# Patient Record
Sex: Female | Born: 1970 | Race: White | Hispanic: No | Marital: Married | Smoking: Never smoker
Health system: Southern US, Community
[De-identification: ages and names within clinical notes are randomized; demographics above are authoritative.]

## PROBLEM LIST (undated history)

## (undated) DIAGNOSIS — C73 Malignant neoplasm of thyroid gland: Secondary | ICD-10-CM

## (undated) DIAGNOSIS — S46219A Strain of muscle, fascia and tendon of other parts of biceps, unspecified arm, initial encounter: Secondary | ICD-10-CM

## (undated) HISTORY — PX: THYROIDECTOMY: SHX17

## (undated) HISTORY — DX: Strain of muscle, fascia and tendon of other parts of biceps, unspecified arm, initial encounter: S46.219A

## (undated) HISTORY — PX: TONSILLECTOMY: SUR1361

## (undated) HISTORY — DX: Malignant neoplasm of thyroid gland: C73

---

## 2001-08-30 ENCOUNTER — Other Ambulatory Visit: Admission: RE | Admit: 2001-08-30 | Discharge: 2001-08-30 | Payer: Self-pay | Admitting: Gynecology

## 2002-01-28 ENCOUNTER — Encounter: Payer: Self-pay | Admitting: Gynecology

## 2002-01-28 ENCOUNTER — Ambulatory Visit (HOSPITAL_COMMUNITY): Admission: AD | Admit: 2002-01-28 | Discharge: 2002-01-28 | Payer: Self-pay | Admitting: Gynecology

## 2002-03-08 ENCOUNTER — Inpatient Hospital Stay (HOSPITAL_COMMUNITY): Admission: AD | Admit: 2002-03-08 | Discharge: 2002-03-10 | Payer: Self-pay | Admitting: Gynecology

## 2002-04-23 ENCOUNTER — Other Ambulatory Visit: Admission: RE | Admit: 2002-04-23 | Discharge: 2002-04-23 | Payer: Self-pay | Admitting: *Deleted

## 2004-08-19 ENCOUNTER — Other Ambulatory Visit: Admission: RE | Admit: 2004-08-19 | Discharge: 2004-08-19 | Payer: Self-pay | Admitting: Gynecology

## 2005-08-24 ENCOUNTER — Other Ambulatory Visit: Admission: RE | Admit: 2005-08-24 | Discharge: 2005-08-24 | Payer: Self-pay | Admitting: Gynecology

## 2005-12-19 HISTORY — PX: AUGMENTATION MAMMAPLASTY: SUR837

## 2006-10-12 ENCOUNTER — Other Ambulatory Visit: Admission: RE | Admit: 2006-10-12 | Discharge: 2006-10-12 | Payer: Self-pay | Admitting: Gynecology

## 2008-03-13 ENCOUNTER — Other Ambulatory Visit: Admission: RE | Admit: 2008-03-13 | Discharge: 2008-03-13 | Payer: Self-pay | Admitting: Gynecology

## 2009-08-13 ENCOUNTER — Encounter: Payer: Self-pay | Admitting: Gynecology

## 2009-08-13 ENCOUNTER — Other Ambulatory Visit: Admission: RE | Admit: 2009-08-13 | Discharge: 2009-08-13 | Payer: Self-pay | Admitting: Gynecology

## 2009-08-13 ENCOUNTER — Ambulatory Visit: Payer: Self-pay | Admitting: Gynecology

## 2010-02-12 ENCOUNTER — Ambulatory Visit: Payer: Self-pay | Admitting: Gynecology

## 2010-07-19 ENCOUNTER — Ambulatory Visit: Payer: Self-pay | Admitting: Gynecology

## 2010-08-16 ENCOUNTER — Other Ambulatory Visit: Admission: RE | Admit: 2010-08-16 | Discharge: 2010-08-16 | Payer: Self-pay | Admitting: Gynecology

## 2010-08-16 ENCOUNTER — Ambulatory Visit: Payer: Self-pay | Admitting: Gynecology

## 2011-01-26 ENCOUNTER — Ambulatory Visit: Payer: Managed Care, Other (non HMO) | Admitting: Gynecology

## 2011-01-26 DIAGNOSIS — N949 Unspecified condition associated with female genital organs and menstrual cycle: Secondary | ICD-10-CM

## 2011-01-27 ENCOUNTER — Ambulatory Visit: Payer: Managed Care, Other (non HMO) | Admitting: Gynecology

## 2011-01-27 ENCOUNTER — Other Ambulatory Visit: Payer: Managed Care, Other (non HMO)

## 2011-02-10 ENCOUNTER — Ambulatory Visit (INDEPENDENT_AMBULATORY_CARE_PROVIDER_SITE_OTHER): Payer: Managed Care, Other (non HMO) | Admitting: Gynecology

## 2011-02-10 DIAGNOSIS — N63 Unspecified lump in unspecified breast: Secondary | ICD-10-CM

## 2011-02-11 ENCOUNTER — Other Ambulatory Visit: Payer: Self-pay | Admitting: Gynecology

## 2011-02-11 DIAGNOSIS — N63 Unspecified lump in unspecified breast: Secondary | ICD-10-CM

## 2011-02-15 ENCOUNTER — Ambulatory Visit
Admission: RE | Admit: 2011-02-15 | Discharge: 2011-02-15 | Disposition: A | Discharge status: Home / Self Care | Payer: Managed Care, Other (non HMO) | Source: Ambulatory Visit | Attending: Gynecology | Admitting: Gynecology

## 2011-02-15 ENCOUNTER — Other Ambulatory Visit: Payer: Self-pay | Admitting: Gynecology

## 2011-02-15 DIAGNOSIS — N63 Unspecified lump in unspecified breast: Secondary | ICD-10-CM

## 2011-05-06 NOTE — Discharge Summary (Signed)
[facility]  Patient:    CALEE, NUGENT Visit Number: [id number] MRN: [id number]          Service Type: OBS Location: 910A [id number] Attending Physician:  Douglass Rivers Dictated by:   Antony Contras, [facility] Admit Date:  03/08/2002 Discharge Date: 03/10/2002                             Discharge Summary  DISCHARGE DIAGNOSES:          1. Intrauterine pregnancy at 37+ weeks.                               2. Prolonged rupture of membranes.  PROCEDURES:                   Normal spontaneous vaginal delivery of viable infant over midline episiotomy.  HISTORY OF PRESENT ILLNESS:   The patient is a 40 year old gravida 2, para 1-0-0-1, with a LMP of June 18, 2001, [facility] March 26, 2002.  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  LABORATORY DATA:              Blood type A positive, antibody screen negative, RPR, HBsAg, HIV nonreactive.  GBS normal.  HOSPITAL COURSE:              The patient presented March 08, 2002, with contraction every 5 minutes, also reported had rupture of membranes x 1 to 2 days.  She was augmented with Pitocin and also given antibiotic prophylaxis.  She progressed to complete dilatation, delivered an Apgar 7/8 female infant weighing 7 pounds 6 ounces over a midline episiotomy with no laceration.  During postpartum course, she remained afebrile, had no difficulty voiding. She was discharged on her second postpartum day in satisfactory condition.  DISPOSITION:                  Follow up in six weeks.  DISCHARGE MEDICATIONS:        Continue with prenatal vitamins, iron, Motrin, Tylox for pain.  DISCHARGE LABORATORY DATA:    CBC: Hematocrit 38, hemoglobin 13.2, WBC 11.8, platelets 223. Dictated by:   Antony Contras, [facility] Attending Physician:  Douglass Rivers DD:  03/20/02 TD:  03/20/02 Job: [id number]

## 2011-05-19 ENCOUNTER — Ambulatory Visit (INDEPENDENT_AMBULATORY_CARE_PROVIDER_SITE_OTHER): Payer: Managed Care, Other (non HMO) | Admitting: Sports Medicine

## 2011-05-19 ENCOUNTER — Encounter: Payer: Self-pay | Admitting: Sports Medicine

## 2011-05-19 VITALS — BP 105/69 | HR 62 | Ht 63.5 in | Wt 120.0 lb

## 2011-05-19 DIAGNOSIS — M76899 Other specified enthesopathies of unspecified lower limb, excluding foot: Secondary | ICD-10-CM

## 2011-05-19 DIAGNOSIS — M79609 Pain in unspecified limb: Secondary | ICD-10-CM

## 2011-05-19 DIAGNOSIS — M79606 Pain in leg, unspecified: Secondary | ICD-10-CM

## 2011-05-19 DIAGNOSIS — M6580 Other synovitis and tenosynovitis, unspecified site: Secondary | ICD-10-CM

## 2011-05-19 MED ORDER — NITROGLYCERIN 0.2 MG/HR TD PT24: 1.0000 | MEDICATED_PATCH | Freq: Every day | TRANSDERMAL | Status: DC

## 2011-05-19 NOTE — Assessment & Plan Note (Signed)
Begin acentric calf exercises. Use one quarter of a nitroglycerin patch daily to encourage blood flow and healing. Okay to remain active but also use a heel lift in all shoes for running. Use a compression sleeve over the knee and upper calf.  Return for recheck in 6 weeks

## 2011-05-19 NOTE — Progress Notes (Signed)
  Subjective:    Patient ID: Alicia Myers, female    DOB: June 21, 1971, 40 y.o.   MRN: [id number]  [facility] patient here for 1 year of worsening right leg pain, now a constant ache.  1-3 miles per day and weights at gym. No injuries or new workout regimen.   Acute onset- locked up one day while running, with extreme pain, will become unlocked but becomes sore for days after injury.  No catching or popping or swelling.  On a typical run, after 1 mile outdoors or 2 miles indoors will start to have ache in the back of her leg.  If she continues to run, will pop, be very sore afterwards and be very hard to bend.  Has run with a 25 incline, no worse.  Has tried wearing knee sleeve with improvement.  Saw Dr. Margaretha Sheffield 1 week ago and had negative knee xray.  Was referred for ultrasound.    Review of Systems see HPI   Objective:   Physical Exam Knee: Normal to inspection with no erythema or effusion or obvious bony abnormalities. Palpation normal with no warmth or joint line tenderness or patellar tenderness or condyle tenderness.   Tender to palpation over right biceps femoris tendon ROM normal in flexion and extension and lower leg rotation. Ligaments with solid consistent endpoints including ACL, PCL, LCL, MCL. Negative Mcmurray's and provocative meniscal tests. Non painful patellar compression. Patellar and quadriceps tendons unremarkable. Hamstring and quadriceps strength is normal.   MSK [place]: Right knee:  Tear of right gastrocnemius insertion to femoral side of post knee with evidence of neovascularization.  There is an intact biceps femoris.  Remainder of Post Knee space is wnl.    Assessment & Plan:

## 2011-05-19 NOTE — Assessment & Plan Note (Addendum)
Discussed hamstring and foot drop exercises.   .  Follow-up in 6 weeks.

## 2011-07-21 ENCOUNTER — Other Ambulatory Visit: Payer: Self-pay | Admitting: *Deleted

## 2011-07-21 DIAGNOSIS — F419 Anxiety disorder, unspecified: Secondary | ICD-10-CM

## 2011-07-21 MED ORDER — ALPRAZOLAM 0.5 MG PO TABS: 0.5000 mg | ORAL_TABLET | Freq: Every evening | ORAL | Status: AC | PRN

## 2011-07-21 NOTE — Telephone Encounter (Signed)
Needs annual exam after 08/14/2011

## 2011-07-21 NOTE — Telephone Encounter (Signed)
[facility] FAXED OVER REFILL REQUEST. IF PT CAN HAVE REFILLS LET ME WILL CALL IN RX.

## 2011-07-21 NOTE — Telephone Encounter (Signed)
RX CALLED IN TO [facility].

## 2011-07-26 ENCOUNTER — Telehealth: Payer: Self-pay | Admitting: *Deleted

## 2011-07-26 DIAGNOSIS — E039 Hypothyroidism, unspecified: Secondary | ICD-10-CM

## 2011-07-26 MED ORDER — LEVOTHYROXINE SODIUM 125 MCG PO TABS: 125.0000 ug | ORAL_TABLET | Freq: Every day | ORAL | Status: DC

## 2011-07-26 NOTE — Telephone Encounter (Signed)
[facility]ARM[facility]Y FAXED OVER REFILL REQUEST FOR SYNTHROID RX. PLEASE ADVISE.

## 2011-07-26 NOTE — Telephone Encounter (Signed)
I refilled her x2 months. She is due for her annual end of August beginning of September.

## 2011-07-29 ENCOUNTER — Ambulatory Visit: Payer: Managed Care, Other (non HMO) | Admitting: Sports Medicine

## 2011-08-04 ENCOUNTER — Encounter: Payer: Self-pay | Admitting: Sports Medicine

## 2011-08-04 ENCOUNTER — Ambulatory Visit (INDEPENDENT_AMBULATORY_CARE_PROVIDER_SITE_OTHER): Payer: Managed Care, Other (non HMO) | Admitting: Sports Medicine

## 2011-08-04 VITALS — BP 125/76 | HR 74 | Temp 98.0°F

## 2011-08-04 DIAGNOSIS — S838X9A Sprain of other specified parts of unspecified knee, initial encounter: Secondary | ICD-10-CM

## 2011-08-04 DIAGNOSIS — M7630 Iliotibial band syndrome, unspecified leg: Secondary | ICD-10-CM | POA: Insufficient documentation

## 2011-08-04 DIAGNOSIS — M6580 Other synovitis and tenosynovitis, unspecified site: Secondary | ICD-10-CM

## 2011-08-04 DIAGNOSIS — M79606 Pain in leg, unspecified: Secondary | ICD-10-CM

## 2011-08-04 DIAGNOSIS — M79609 Pain in unspecified limb: Secondary | ICD-10-CM

## 2011-08-04 DIAGNOSIS — S86119A Strain of other muscle(s) and tendon(s) of posterior muscle group at lower leg level, unspecified leg, initial encounter: Secondary | ICD-10-CM | POA: Insufficient documentation

## 2011-08-04 NOTE — Progress Notes (Signed)
  Subjective:    Patient ID: Alicia Myers, female    DOB: Jan 01, 1971, 40 y.o.   MRN: [id number]  HPI  Alicia Myers is a 40 yo female patient avid runner coming today to F/U on her right knee gastrocnemius tear. This is a cryonic condition. She has had this pain for a year. There was not a particular injury that produced the pain. She was seen on 05/12, MSK U/S was done which showed a defect in the tendon of the lateral head of the gastrocnemius. She was started on the nitro patch. She states that she has improved compare with her previous visit, she is able to run a mile longer. Her pain is a dull ache, 3/10, located on the posterolateral aspect of her knee, worsen by running ,improved by resting.  Also  numbness on her second and third toe while running for the last week.  Past Medical History  Diagnosis Date  . Thyroid cancer     PAPILLARY THYROID CA --THYROIDECTOMY   Current Outpatient Prescriptions on File Prior to Visit  Medication Sig Dispense Refill  . ALPRAZolam (XANAX) 0.5 MG tablet Take 1 tablet (0.5 mg total) by mouth at bedtime as needed for sleep.  30 tablet  0  . Levonorgest-Eth Estrad 91-Day (SEASONIQUE) 0.15-0.03 &0.01 MG TABS Take 1 tablet by mouth daily.        Marland Kitchen levothyroxine (SYNTHROID, LEVOTHROID) 125 MCG tablet Take 1 tablet (125 mcg total) by mouth daily.  30 tablet  1  . nitroGLYCERIN (NITRODUR - DOSED IN MG/24 HR) 0.2 mg/hr Place 1 patch (0.2 mg total) onto the skin daily.  30 patch  1   Allergies  Allergen Reactions  . Sulfa Antibiotics Hives and Other (See Comments)    Passed out     Review of Systems  Constitutional: Negative for fever, diaphoresis and fatigue.  Musculoskeletal: Negative for back pain and joint swelling.       Right posterior knee pain with HPI  Neurological: Negative for weakness and numbness.       Objective:   Physical Exam  Constitutional: She appears well-developed and well-nourished.       BP 125/76  Pulse 74  Temp(Src)  98 F (36.7 C) (Oral)   Eyes: EOM are normal.  Pulmonary/Chest: Effort normal.  Musculoskeletal:       Right knee with intact skin, FROM. No Patellofemoral crepitus present with flexion and extension. No tenderness on the quad neither or patellar tendon. Ligaments intact. Lachman neg. Varus and valgus test at 0 and 30 degres neg TTP in the posterolateral aspect of her knee on the tendon of the lateral head of the gastrocnemius. There is mild TTP on the iliotibial band . Ober test negative.  Good quad muscle definition.  Normal gait without a limp.   Neurological: She is alert.  Skin: No rash noted. No erythema. No pallor.  Psychiatric: She has a normal mood and affect. Judgment normal.     MSK U/S of the knee with small tear in the tendon of the lateral head of the gastroc, mild vascular flow. Ulnar nerve intact. Biceps tendon intact.     Assessment & Plan:   1. Gastrocnemius muscle tear   2. Leg pain   3. Iliotibial band tendonitis    Cont nitro patch. Cont gastroc strengthening and stretching exercise. Iliotibial band stretches shown. Handout given Cont heel lift. F/U in 4 weeks.

## 2011-09-07 ENCOUNTER — Other Ambulatory Visit (HOSPITAL_COMMUNITY)
Admission: RE | Admit: 2011-09-07 | Discharge: 2011-09-07 | Disposition: A | Discharge status: Home / Self Care | Payer: Managed Care, Other (non HMO) | Source: Ambulatory Visit | Attending: Gynecology | Admitting: Gynecology

## 2011-09-07 ENCOUNTER — Ambulatory Visit (INDEPENDENT_AMBULATORY_CARE_PROVIDER_SITE_OTHER): Payer: Managed Care, Other (non HMO) | Admitting: Gynecology

## 2011-09-07 ENCOUNTER — Encounter: Payer: Self-pay | Admitting: Gynecology

## 2011-09-07 VITALS — BP 112/70 | Ht 64.0 in | Wt 112.0 lb

## 2011-09-07 DIAGNOSIS — E039 Hypothyroidism, unspecified: Secondary | ICD-10-CM

## 2011-09-07 DIAGNOSIS — Z131 Encounter for screening for diabetes mellitus: Secondary | ICD-10-CM

## 2011-09-07 DIAGNOSIS — S46219A Strain of muscle, fascia and tendon of other parts of biceps, unspecified arm, initial encounter: Secondary | ICD-10-CM | POA: Insufficient documentation

## 2011-09-07 DIAGNOSIS — Z01419 Encounter for gynecological examination (general) (routine) without abnormal findings: Secondary | ICD-10-CM | POA: Insufficient documentation

## 2011-09-07 DIAGNOSIS — Z13220 Encounter for screening for lipoid disorders: Secondary | ICD-10-CM

## 2011-09-07 MED ORDER — NORETHINDRONE ACET-ETHINYL EST 1-20 MG-MCG PO TABS: 1.0000 | ORAL_TABLET | Freq: Every day | ORAL | Status: DC

## 2011-09-07 NOTE — Progress Notes (Signed)
Alicia Myers [birth year] [id number]        40 y.o.  for annual exam.  Complaining of 1 month history of fatigue. She is on thyroid replacement. No other symptoms such as weight changes hair changes skin changes.  Past medical history,surgical history, medications, allergies, family history and social history were all reviewed and documented in the EPIC chart. ROS:  Was performed and pertinent positives and negatives are included in the history.  Exam: chaperone present Filed Vitals:   09/07/11 1036  BP: 112/70   General appearance  Normal Skin grossly normal Head/Neck normal with no cervical or supraclavicular adenopathy thyroid normal Lungs  clear Cardiac RR, without RMG Abdominal  soft, nontender, without masses, organomegaly or hernia Breasts  examined lying and sitting.  Left without masses, retractions, discharge or axillary adenopathy.  Right small subcutaneous nodule 12:00 3 fingerbreadths off the nipple freely mobile no overlying skin changes, nipple discharge, retraction, axillary adenopathy.  Bilateral implants noted.  Pelvic  Ext/BUS/vagina  normal   Cervix  normal  Pap done  Uterus  anteverted, normal size, shape and contour, midline and mobile nontender   Adnexa  Without masses or tenderness    Anus and perineum  normal   Rectovaginal  normal sphincter tone without palpated masses or tenderness.    Assessment/Plan:  40 y.o. female for annual exam.    #1 Right breast lump. Discovered earlier this year. It is stable on exam both to the patient and myself. She had a negative diagnostic mammography and a negative ultrasound in February. Options for restudied now continued observation or excisional biopsy were reviewed. Patient's comfortable with observation she'll followup for recheck of mammogram ultrasound in February as recommended by the radiologist sooner if this changes at all or she changes her mind and wants to have it excised. #2 Fatigue. I suspect this is  situational but we'll go ahead and check TSH to make sure that she is on appropriate thyroid replacement.  We'll also check CBC. #3  Contraception. Patient using Loestrin 1/20 continuous with every three-month withdrawal doing well. Wants to continue and I refilled her times a year. We will check baseline lipid profile glucose urinalysis with the above blood work.     Dara Lords MD, 11:07 AM 09/07/2011

## 2011-09-14 ENCOUNTER — Other Ambulatory Visit: Payer: Managed Care, Other (non HMO) | Admitting: Sports Medicine

## 2011-09-16 ENCOUNTER — Other Ambulatory Visit: Payer: Self-pay | Admitting: Gynecology

## 2011-09-16 DIAGNOSIS — E039 Hypothyroidism, unspecified: Secondary | ICD-10-CM

## 2011-09-16 MED ORDER — LEVOTHYROXINE SODIUM 112 MCG PO TABS: 112.0000 ug | ORAL_TABLET | Freq: Every day | ORAL | Status: DC

## 2011-10-03 ENCOUNTER — Other Ambulatory Visit: Payer: Self-pay | Admitting: Vascular Surgery

## 2011-10-03 ENCOUNTER — Ambulatory Visit (INDEPENDENT_AMBULATORY_CARE_PROVIDER_SITE_OTHER): Payer: Managed Care, Other (non HMO) | Admitting: Sports Medicine

## 2011-10-03 VITALS — BP 100/70

## 2011-10-03 DIAGNOSIS — S86119A Strain of other muscle(s) and tendon(s) of posterior muscle group at lower leg level, unspecified leg, initial encounter: Secondary | ICD-10-CM

## 2011-10-03 DIAGNOSIS — S838X9A Sprain of other specified parts of unspecified knee, initial encounter: Secondary | ICD-10-CM

## 2011-10-03 DIAGNOSIS — M79606 Pain in leg, unspecified: Secondary | ICD-10-CM

## 2011-10-03 NOTE — Progress Notes (Signed)
  Subjective:    Patient ID: Alicia Myers, female    DOB: 11-08-1971, 40 y.o.   MRN: [id number]  HPI  40 y/o female is here for follow up for a right gastronemius tear. She has been on NTG since May.  She noticed improvement initially but there has been no change since her last visit.  She is unable to run. She walks at a 10% incline at 4.0 mph on the treadmill with only a small amount of discomfort.  If she tries to run she will get pain and sometimes numbness of the foot prior to one mile.  She wears heels for work and finds this much more comfortable than flat shoes.  Compression sleeve does help make the post knee feel more stable and less pain  Review of Systems     Objective:   Physical Exam  Palpation of the head of the medial gastroc reproduces her pain Mild tenderness to palpation about the distal hamstring, different from her usual pain with running No erythema or swelling over the area of pain ROM at the knee is normal There is no pain or swelling about the anterior knee The hamstring strength is normal with neutral foot, inversion, and eversion  Review of [place] does suggest small insertional tear of tendon      Assessment & Plan:

## 2011-10-03 NOTE — Assessment & Plan Note (Signed)
An MRI is ordered to further evaluate the area of pain.  Because this is the area where the gastrocnemius and the hamstrings co mingle it is possible that she also has a tear of the distal hamstring, which would have a surgical solution.  Discussed with the patient that if her MRI doesn't show any finding in addition to the gastroc tear that there will likley not be a surgical solution.

## 2011-10-03 NOTE — Patient Instructions (Signed)
Your MRI appointment has been scheduled for 10/06/11 at 12:30 at [address] [facility].

## 2011-10-05 ENCOUNTER — Telehealth: Payer: Self-pay | Admitting: Sports Medicine

## 2011-10-05 ENCOUNTER — Other Ambulatory Visit: Payer: Self-pay | Admitting: Sports Medicine

## 2011-10-05 ENCOUNTER — Other Ambulatory Visit: Payer: Self-pay | Admitting: *Deleted

## 2011-10-05 DIAGNOSIS — M79606 Pain in leg, unspecified: Secondary | ICD-10-CM

## 2011-10-05 DIAGNOSIS — S86119A Strain of other muscle(s) and tendon(s) of posterior muscle group at lower leg level, unspecified leg, initial encounter: Secondary | ICD-10-CM

## 2011-10-05 NOTE — Telephone Encounter (Signed)
[facility] is needing an order faxed to them for the mri.  Fax number is [phone]

## 2011-10-06 ENCOUNTER — Ambulatory Visit
Admission: RE | Admit: 2011-10-06 | Discharge: 2011-10-06 | Disposition: A | Discharge status: Home / Self Care | Payer: Managed Care, Other (non HMO) | Source: Ambulatory Visit | Attending: Sports Medicine | Admitting: Sports Medicine

## 2011-10-06 ENCOUNTER — Other Ambulatory Visit: Payer: Managed Care, Other (non HMO)

## 2011-10-06 DIAGNOSIS — M79606 Pain in leg, unspecified: Secondary | ICD-10-CM

## 2011-10-27 ENCOUNTER — Other Ambulatory Visit: Payer: Self-pay | Admitting: *Deleted

## 2011-10-27 MED ORDER — ALPRAZOLAM 0.5 MG PO TABS: 0.5000 mg | ORAL_TABLET | Freq: Every evening | ORAL | Status: DC | PRN

## 2011-10-27 NOTE — Telephone Encounter (Signed)
rx called into pharmacy

## 2011-11-22 ENCOUNTER — Other Ambulatory Visit: Payer: Self-pay | Admitting: *Deleted

## 2011-11-22 MED ORDER — ALPRAZOLAM 0.5 MG PO TABS: 0.5000 mg | ORAL_TABLET | Freq: Every evening | ORAL | Status: DC | PRN

## 2011-11-22 NOTE — Telephone Encounter (Signed)
rx called into pharmacy

## 2012-02-27 ENCOUNTER — Other Ambulatory Visit: Payer: Self-pay | Admitting: *Deleted

## 2012-02-27 MED ORDER — ALPRAZOLAM 0.5 MG PO TABS: 0.5000 mg | ORAL_TABLET | Freq: Every evening | ORAL | Status: DC | PRN

## 2012-02-27 NOTE — Telephone Encounter (Signed)
rx called in

## 2012-03-28 ENCOUNTER — Other Ambulatory Visit: Payer: Self-pay | Admitting: *Deleted

## 2012-03-28 MED ORDER — ALPRAZOLAM 0.5 MG PO TABS: 0.5000 mg | ORAL_TABLET | Freq: Every evening | ORAL | Status: DC | PRN

## 2012-03-28 NOTE — Telephone Encounter (Signed)
rx called in

## 2012-04-12 ENCOUNTER — Other Ambulatory Visit: Payer: Managed Care, Other (non HMO)

## 2012-04-12 ENCOUNTER — Telehealth: Payer: Self-pay | Admitting: *Deleted

## 2012-04-12 DIAGNOSIS — E039 Hypothyroidism, unspecified: Secondary | ICD-10-CM

## 2012-04-12 LAB — TSH: TSH: 2.183 u[IU]/mL (ref 0.350–4.500)

## 2012-04-12 NOTE — Telephone Encounter (Signed)
Pt came in and asked about her TSH. She wanted it checked. I talked to [name] since TF wasn't here and she said ok. [name] had not called back last fall when last had it checked and so therefore Alicia Myers said ok to get it checked now before changing thyroid medication dose. Pt will be notified of results

## 2012-04-26 DIAGNOSIS — Z85850 Personal history of malignant neoplasm of thyroid: Secondary | ICD-10-CM | POA: Insufficient documentation

## 2012-04-26 DIAGNOSIS — E039 Hypothyroidism, unspecified: Secondary | ICD-10-CM | POA: Insufficient documentation

## 2012-04-26 DIAGNOSIS — F419 Anxiety disorder, unspecified: Secondary | ICD-10-CM | POA: Insufficient documentation

## 2012-06-08 ENCOUNTER — Other Ambulatory Visit (HOSPITAL_COMMUNITY): Payer: Self-pay | Admitting: Plastic Surgery

## 2012-06-08 DIAGNOSIS — S0285XA Fracture of orbit, unspecified, initial encounter for closed fracture: Secondary | ICD-10-CM

## 2012-06-12 ENCOUNTER — Other Ambulatory Visit (HOSPITAL_COMMUNITY): Payer: Managed Care, Other (non HMO)

## 2012-06-15 ENCOUNTER — Other Ambulatory Visit: Payer: Self-pay | Admitting: Gynecology

## 2012-06-26 ENCOUNTER — Other Ambulatory Visit: Payer: Self-pay

## 2012-06-26 MED ORDER — ALPRAZOLAM 0.5 MG PO TABS: 0.5000 mg | ORAL_TABLET | Freq: Every evening | ORAL | Status: DC | PRN

## 2012-06-26 NOTE — Telephone Encounter (Signed)
RX CALLED INTO [facility] AT [facility].

## 2012-08-14 ENCOUNTER — Other Ambulatory Visit: Payer: Self-pay | Admitting: Gynecology

## 2012-08-14 MED ORDER — LEVONORGEST-ETH EST & ETH EST 42-21-21-7 DAYS PO TABS: 1.0000 | ORAL_TABLET | Freq: Every day | ORAL | Status: DC

## 2012-08-14 NOTE — Progress Notes (Signed)
Patient stopped by office and asked to try quartette birth control pills which is in a new extended formulation. When pack prescribed. Patient due for her annual in next month and will follow for this.

## 2012-08-23 ENCOUNTER — Ambulatory Visit (INDEPENDENT_AMBULATORY_CARE_PROVIDER_SITE_OTHER): Payer: Managed Care, Other (non HMO) | Admitting: Sports Medicine

## 2012-08-23 VITALS — Ht 64.0 in | Wt 120.0 lb

## 2012-08-23 DIAGNOSIS — M7630 Iliotibial band syndrome, unspecified leg: Secondary | ICD-10-CM

## 2012-08-23 DIAGNOSIS — M6580 Other synovitis and tenosynovitis, unspecified site: Secondary | ICD-10-CM

## 2012-08-23 NOTE — Progress Notes (Signed)
Alicia Myers is a 41 y.o. female who presents to [facility] today for left knee pain. Patient has had left lateral knee pain for the last 6 months. The pain occurred without injury and is not associated with swelling locking or catching. She is a runner with a goal of running 15-20 miles per week. She had to stop running because of the pain. She did a period of relative rest and tried to run a 5K on July 4 and had significant pain. She has been unable to run more than 1 mile because of pain since July.   She has not tried any specific exercises or medications.  Running worsens her pain rest improves her pain. No weakness numbness or radiating pain.   PMH reviewed. Significant for history of thyroid cancer History  Substance Use Topics  . Smoking status: Never Smoker   . Smokeless tobacco: Never Used  . Alcohol Use: Yes   ROS as above otherwise neg   Exam:  Ht 5\' 4"  (1.626 m)  Wt 120 lb (54.432 kg)  BMI 20.60 kg/m2 Gen: Well NAD MSK: Left knee: Normal-appearing nontender throughout. Range of motion normal Negative patellar grind test. Negative Lachman's and anterior drawer. Negative McMurray's and Thessaly tests.  Negative valgus and varus stress.  Hips:  Range of motion normal.  Left hip abduction 4/5 right hip abduction 5/5.  Hip flexion and adduction are normal bilaterally.  Feet:  Morton's feet bilaterally with some collapse of longitudinal arch with normal heel valgus on toe standing. Foot motion sensation and pulses are normal bilaterally.   Leg length:  No leg length discrepancy  Gait:  Forefoot Striker.   Left foot mild external rotation.  Slightly widened gait.  Otherwise normal without significant dynamic genu valgum

## 2012-08-23 NOTE — Assessment & Plan Note (Signed)
Patient with left distal IT band. Plan:  Body helix knee sleeve,  Sports insoles with replaced heel lifts for history of gastrocnemius muscle tear Hip abductor strengthening exercises/ Graduated return to running Followup in 6 weeks or sooner if needed

## 2012-08-23 NOTE — Patient Instructions (Addendum)
Thank you for coming in today. 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  Exercises: Hip abduction strength.  1) Side leg lifts 15 reps 3 sets 2x a day.  2) Step ups.  3) Side steps ups 4) Cross over step ups.   Try the IT band stretches.   Try the insoles and knee sleeve.   Come back in 6 weeks or so.   Advance running as tolerated by pain.

## 2012-08-28 ENCOUNTER — Other Ambulatory Visit: Payer: Self-pay | Admitting: *Deleted

## 2012-08-28 MED ORDER — ALPRAZOLAM 0.5 MG PO TABS: 0.5000 mg | ORAL_TABLET | Freq: Every evening | ORAL | Status: DC | PRN

## 2012-08-28 NOTE — Telephone Encounter (Signed)
Annual scheduled

## 2012-09-11 ENCOUNTER — Encounter: Payer: Self-pay | Admitting: Gynecology

## 2012-09-11 ENCOUNTER — Ambulatory Visit (INDEPENDENT_AMBULATORY_CARE_PROVIDER_SITE_OTHER): Payer: Managed Care, Other (non HMO) | Admitting: Gynecology

## 2012-09-11 VITALS — BP 116/62 | Ht 64.0 in | Wt 124.0 lb

## 2012-09-11 DIAGNOSIS — E039 Hypothyroidism, unspecified: Secondary | ICD-10-CM

## 2012-09-11 DIAGNOSIS — Z01419 Encounter for gynecological examination (general) (routine) without abnormal findings: Secondary | ICD-10-CM

## 2012-09-11 DIAGNOSIS — Z13220 Encounter for screening for lipoid disorders: Secondary | ICD-10-CM

## 2012-09-11 DIAGNOSIS — Z131 Encounter for screening for diabetes mellitus: Secondary | ICD-10-CM

## 2012-09-11 LAB — CBC WITH DIFFERENTIAL/PLATELET
Basophils Absolute: 0.1 10*3/uL (ref 0.0–0.1)
Basophils Relative: 1 % (ref 0–1)
Eosinophils Absolute: 0.2 10*3/uL (ref 0.0–0.7)
MCHC: 33.7 g/dL (ref 30.0–36.0)
Monocytes Absolute: 0.4 10*3/uL (ref 0.1–1.0)
Neutro Abs: 4 10*3/uL (ref 1.7–7.7)
Neutrophils Relative %: 59 % (ref 43–77)
RDW: 13 % (ref 11.5–15.5)

## 2012-09-11 LAB — LIPID PANEL
Cholesterol: 187 mg/dL (ref 0–200)
VLDL: 20 mg/dL (ref 0–40)

## 2012-09-11 NOTE — Progress Notes (Signed)
Alicia Myers 16-May-1971 [id number]        41 y.o.  G2P2 for annual exam.  Doing well.  Past medical history,surgical history, medications, allergies, family history and social history were all reviewed and documented in the EPIC chart. ROS:  Was performed and pertinent positives and negatives are included in the history.  Exam: Alicia Myers assistant Filed Vitals:   09/11/12 1005  BP: 116/62  Height: 5\' 4"  (1.626 m)  Weight: 124 lb (56.246 kg)   General appearance  Normal Skin grossly normal Head/Neck normal with no cervical or supraclavicular adenopathy thyroid normal Lungs  clear Cardiac RR, without RMG Abdominal  soft, nontender, without masses, organomegaly or hernia Breasts  examined lying and sitting without masses, retractions, discharge or axillary adenopathy.  Bilateral implants noted. Pelvic  Ext/BUS/vagina  normal   Cervix  normal   Uterus  anteverted, normal size, shape and contour, midline and mobile nontender   Adnexa  Without masses or tenderness    Anus and perineum  normal   Rectovaginal  normal sphincter tone without palpated masses or tenderness.    Assessment/Plan:  41 y.o. G2P2 female for annual exam.   1. Contraception. Patient on Quartette extended low dose BCP. Doing well wants to continue and we'll refill x1 year. 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 2. Breast health.  Patient had  Right breast mass last year 12:00 position 3 cm from the areola. Follow up mammogram/ultrasound was negative the patient notes that she can no longer feel this area on self-exam. Her exam today is totally normal without any palpable abnormalities. I suspect this was an area of normal tissue that has become less prominent. She'll continue with self breast exams as long as no palpable areas we'll continue to follow. She is overdue for her mammogram I recommended her to schedule this and she agrees to do  so. 3. Pap smear. Last Pap smear 2012. No Pap smear done today. Patient has no history of abnormal Pap smears with numerous normal records in her chart. Will plan less frequent screening every 3-5 years by current screening guidelines. 4. Pregnancy. She did have questions about pregnancy in the 32s. I discussed the increased risk of chromosomal abnormalities as well as the increased risk of metabolic maternal diseases such as gestational diabetes and gestational hypertension with advancing maternal age. Reviewed prenatal diagnostic testing options. Patient not interested in actively pursuing at this time but just had general questions. 5. Health maintenance.  CBC lipid profile glucose urinalysis and TSH ordered with her history of hypothyroidism. Follow up one year, sooner as needed.    Dara Lords MD, 12:17 PM 09/11/2012

## 2012-09-11 NOTE — Patient Instructions (Signed)
Follow up in one year for annual exam 

## 2012-09-12 LAB — URINALYSIS W MICROSCOPIC + REFLEX CULTURE
Bacteria, UA: NONE SEEN
Hgb urine dipstick: NEGATIVE
Ketones, ur: NEGATIVE mg/dL
Nitrite: NEGATIVE
Specific Gravity, Urine: 1.024 (ref 1.005–1.030)
Urobilinogen, UA: 0.2 mg/dL (ref 0.0–1.0)

## 2012-09-12 LAB — TSH: TSH: 3.187 u[IU]/mL (ref 0.350–4.500)

## 2012-09-22 ENCOUNTER — Other Ambulatory Visit: Payer: Self-pay | Admitting: Gynecology

## 2012-10-02 ENCOUNTER — Other Ambulatory Visit: Payer: Self-pay | Admitting: *Deleted

## 2012-10-03 MED ORDER — ALPRAZOLAM 0.5 MG PO TABS: 0.5000 mg | ORAL_TABLET | Freq: Every evening | ORAL | Status: DC | PRN

## 2012-10-04 ENCOUNTER — Ambulatory Visit: Payer: Managed Care, Other (non HMO) | Admitting: Sports Medicine

## 2012-11-29 ENCOUNTER — Other Ambulatory Visit: Payer: Self-pay | Admitting: *Deleted

## 2012-11-29 MED ORDER — ALPRAZOLAM 0.5 MG PO TABS: 0.5000 mg | ORAL_TABLET | Freq: Every evening | ORAL | Status: DC | PRN

## 2012-11-29 NOTE — Telephone Encounter (Signed)
Called in [facility] 

## 2012-12-09 ENCOUNTER — Other Ambulatory Visit: Payer: Self-pay | Admitting: Gynecology

## 2012-12-31 ENCOUNTER — Other Ambulatory Visit: Payer: Self-pay | Admitting: *Deleted

## 2012-12-31 MED ORDER — ALPRAZOLAM 0.5 MG PO TABS: 0.5000 mg | ORAL_TABLET | Freq: Every evening | ORAL | Status: DC | PRN

## 2012-12-31 NOTE — Telephone Encounter (Signed)
Last filled 11/29/12

## 2013-01-01 NOTE — Telephone Encounter (Signed)
Called in [facility] 

## 2013-03-27 ENCOUNTER — Other Ambulatory Visit: Payer: Self-pay | Admitting: *Deleted

## 2013-03-27 MED ORDER — ALPRAZOLAM 0.5 MG PO TABS: 0.5000 mg | ORAL_TABLET | Freq: Every evening | ORAL | Status: DC | PRN

## 2013-03-27 NOTE — Telephone Encounter (Signed)
Rx request 

## 2013-03-28 NOTE — Telephone Encounter (Signed)
rx called in

## 2013-04-13 ENCOUNTER — Other Ambulatory Visit: Payer: Self-pay | Admitting: Gynecology

## 2013-07-29 ENCOUNTER — Other Ambulatory Visit: Payer: Self-pay | Admitting: Gynecology

## 2013-09-13 ENCOUNTER — Encounter: Payer: Self-pay | Admitting: Gynecology

## 2013-09-13 ENCOUNTER — Ambulatory Visit (INDEPENDENT_AMBULATORY_CARE_PROVIDER_SITE_OTHER): Payer: 59 | Admitting: Gynecology

## 2013-09-13 VITALS — BP 112/68 | Ht 64.0 in | Wt 120.0 lb

## 2013-09-13 DIAGNOSIS — Z01419 Encounter for gynecological examination (general) (routine) without abnormal findings: Secondary | ICD-10-CM

## 2013-09-13 DIAGNOSIS — C73 Malignant neoplasm of thyroid gland: Secondary | ICD-10-CM

## 2013-09-13 MED ORDER — LEVOTHYROXINE SODIUM 112 MCG PO TABS: ORAL_TABLET | ORAL | Status: DC

## 2013-09-13 MED ORDER — NORETHIN ACE-ETH ESTRAD-FE 1-20 MG-MCG PO TABS: ORAL_TABLET | ORAL | Status: DC

## 2013-09-13 NOTE — Patient Instructions (Signed)
Call to Schedule your mammogram  Facilities in [place]: 1)  [facility], [address] Phone: [phone] 2)  [facility]. [facility], [address] Phone: [phone] 3)  Dr. Bertrand at [facility]  [address] Phone: [phone]     Mammogram A mammogram is an X-ray test to find changes in a woman's breast. You should get a mammogram if:  You are 40 years of age or older  You have risk factors.   Your doctor recommends that you have one.  BEFORE THE TEST  Do not schedule the test the week before your period, especially if your breasts are sore during this time.  On the day of your mammogram:  Wash your breasts and armpits well. After washing, do not put on any deodorant or talcum powder on until after your test.   Eat and drink as you usually do.   Take your medicines as usual.   If you are diabetic and take insulin, make sure you:   Eat before coming for your test.   Take your insulin as usual.   If you cannot keep your appointment, call before the appointment to cancel. Schedule another appointment.  TEST  You will need to undress from the waist up. You will put on a hospital gown.   Your breast will be put on the mammogram machine, and it will press firmly on your breast with a piece of plastic called a compression paddle. This will make your breast flatter so that the machine can X-ray all parts of your breast.   Both breasts will be X-rayed. Each breast will be X-rayed from above and from the side. An X-ray might need to be taken again if the picture is not good enough.   The mammogram will last about 15 to 30 minutes.  AFTER THE TEST Finding out the results of your test Ask when your test results will be ready. Make sure you get your test results.  Document Released: 03/03/2009 Document Revised: 11/24/2011 Document Reviewed: 03/03/2009 [employer] Patient  Information 2012 [employer].   

## 2013-09-13 NOTE — Progress Notes (Signed)
Alicia Myers 04-03-1971 [id number]        42 y.o.  G2P2 for annual exam.  Doing well.  Past medical history,surgical history, medications, allergies, family history and social history were all reviewed and documented in the EPIC chart.  ROS:  Performed and pertinent positives and negatives are included in the history, assessment and plan .  Exam: Kim assistant Filed Vitals:   09/13/13 0918  BP: 112/68  Height: 5\' 4"  (1.626 m)  Weight: 120 lb (54.432 kg)   General appearance  Normal Skin grossly normal Head/Neck normal with no cervical or supraclavicular adenopathy thyroid normal Lungs  clear Cardiac RR, without RMG Abdominal  soft, nontender, without masses, organomegaly or hernia Breasts  examined lying and sitting without masses, retractions, discharge or axillary adenopathy. Bilateral implants noted Pelvic  Ext/BUS/vagina  normal  Cervix  normal   Uterus  anteverted, normal size, shape and contour, midline and mobile nontender   Adnexa  Without masses or tenderness    Anus and perineum  normal   Rectovaginal  normal sphincter tone without palpated masses or tenderness.    Assessment/Plan:  42 y.o. G2P2 female for annual exam, regular menses.  1. Birth control. Patient's actually off of her birth control pills at present. Contemplating pregnancy trial. Has been seeing Dr. April Manson. I reviewed increased risk of chromosomal abnormalities which advancing maternal age and increasing metabolic risks such as hypertension diabetes. Patient's thinking about reinitiating her pills at least for now and I refilled her Loestrin 120 equivalent x1 year. We have discussed multiple times the risks of pills which advancing age includes stroke heart attack DVT. 2. Recent evaluation for dizziness by her primary with multiple blood work done. Relates TSH was normal. We'll followup with them as needed. Does not appear to be an issue at this time. 3. History of papillary thyroid  carcinoma status post thyroidectomy. I refilled her Synthroid 112 mcg x1 year. Again relates having a recent normal TSH. 4. Anxiety. Uses intermittent Xanax 0.5 mg. She has a supply at home and will call if she needs more. 5. Mammography. Patient overdue for mammogram and needs to schedule and agrees to do so. SBE monthly review. 6. Pap smear 2012. No Pap smear done today. No history of abnormal Pap smears previously. We'll plan repeat Pap smear next year 3 year interval. 7. Health maintenance. No lab work done this has been recently done at her primary physician's office. Followup one year, sooner as needed.  Note: This document was prepared with digital dictation and possible smart phrase technology. Any transcriptional errors that result from this process are unintentional.   Dara Lords MD, 10:01 AM 09/13/2013

## 2013-09-14 LAB — URINALYSIS W MICROSCOPIC + REFLEX CULTURE
Bacteria, UA: NONE SEEN
Bilirubin Urine: NEGATIVE
Casts: NONE SEEN
Crystals: NONE SEEN
Ketones, ur: NEGATIVE mg/dL
Protein, ur: NEGATIVE mg/dL
Specific Gravity, Urine: 1.016 (ref 1.005–1.030)
Urobilinogen, UA: 0.2 mg/dL (ref 0.0–1.0)
pH: 6.5 (ref 5.0–8.0)

## 2013-10-06 ENCOUNTER — Other Ambulatory Visit: Payer: Self-pay | Admitting: Gynecology

## 2013-10-07 NOTE — Telephone Encounter (Signed)
Rx called in to pharmacy. 

## 2014-07-21 ENCOUNTER — Other Ambulatory Visit: Payer: Self-pay

## 2014-07-21 DIAGNOSIS — Z1231 Encounter for screening mammogram for malignant neoplasm of breast: Secondary | ICD-10-CM

## 2014-07-28 ENCOUNTER — Ambulatory Visit
Admission: RE | Admit: 2014-07-28 | Discharge: 2014-07-28 | Disposition: A | Discharge status: Home / Self Care | Payer: 59 | Source: Ambulatory Visit

## 2014-07-28 DIAGNOSIS — Z1231 Encounter for screening mammogram for malignant neoplasm of breast: Secondary | ICD-10-CM

## 2014-10-20 ENCOUNTER — Encounter: Payer: Self-pay | Admitting: Gynecology

## 2014-10-31 ENCOUNTER — Other Ambulatory Visit: Payer: Self-pay | Admitting: Gynecology

## 2014-11-18 ENCOUNTER — Encounter: Payer: 59 | Admitting: Gynecology

## 2014-11-27 ENCOUNTER — Other Ambulatory Visit (HOSPITAL_COMMUNITY)
Admission: RE | Admit: 2014-11-27 | Discharge: 2014-11-27 | Disposition: A | Discharge status: Home / Self Care | Payer: 59 | Source: Ambulatory Visit | Attending: Gynecology | Admitting: Gynecology

## 2014-11-27 ENCOUNTER — Encounter: Payer: Self-pay | Admitting: Gynecology

## 2014-11-27 ENCOUNTER — Ambulatory Visit (INDEPENDENT_AMBULATORY_CARE_PROVIDER_SITE_OTHER): Payer: 59 | Admitting: Gynecology

## 2014-11-27 VITALS — BP 112/66 | Ht 64.0 in | Wt 119.0 lb

## 2014-11-27 DIAGNOSIS — Z01411 Encounter for gynecological examination (general) (routine) with abnormal findings: Secondary | ICD-10-CM | POA: Insufficient documentation

## 2014-11-27 DIAGNOSIS — Z1151 Encounter for screening for human papillomavirus (HPV): Secondary | ICD-10-CM | POA: Diagnosis present

## 2014-11-27 DIAGNOSIS — E039 Hypothyroidism, unspecified: Secondary | ICD-10-CM

## 2014-11-27 DIAGNOSIS — Z01419 Encounter for gynecological examination (general) (routine) without abnormal findings: Secondary | ICD-10-CM

## 2014-11-27 DIAGNOSIS — N926 Irregular menstruation, unspecified: Secondary | ICD-10-CM

## 2014-11-27 LAB — PROLACTIN: Prolactin: 6.1 ng/mL

## 2014-11-27 LAB — FOLLICLE STIMULATING HORMONE: FSH: 58.3 m[IU]/mL

## 2014-11-27 LAB — TSH: TSH: 0.399 u[IU]/mL (ref 0.350–4.500)

## 2014-11-27 MED ORDER — ALPRAZOLAM 0.5 MG PO TABS: 0.5000 mg | ORAL_TABLET | Freq: Every evening | ORAL | Status: DC | PRN

## 2014-11-27 MED ORDER — LEVOTHYROXINE SODIUM 112 MCG PO TABS: 112.0000 ug | ORAL_TABLET | Freq: Every day | ORAL | Status: DC

## 2014-11-27 NOTE — Progress Notes (Signed)
Marita Snellen Ciliberti 02-21-1971 [id number]        43 y.o.  G2P2 for annual exam.  Several issues noted below.  Past medical history,surgical history, problem list, medications, allergies, family history and social history were all reviewed and documented as reviewed in the EPIC chart.  ROS:  12 system ROS performed with pertinent positives and negatives included in the history, assessment and plan.   Additional significant findings :  none   Exam: Kim Counsellor Vitals:   11/27/14 0834  BP: 112/66  Height: 5\' 4"  (1.626 m)  Weight: 119 lb (53.978 kg)   General appearance:  Normal affect, orientation and appearance. Skin: Grossly normal HEENT: Without gross lesions.  No cervical or supraclavicular adenopathy. Thyroid normal. Otoscopic exam normal with good light reflex and no evidence of serous otitis Lungs:  Clear without wheezing, rales or rhonchi Cardiac: RR, without RMG Abdominal:  Soft, nontender, without masses, guarding, rebound, organomegaly or hernia Breasts:  Examined lying and sitting without masses, retractions, discharge or axillary adenopathy.  Bilateral implants noted Pelvic:  Ext/BUS/vagina normal  Cervix normal. Pap/HPV  Uterus anteverted, normal size, shape and contour, midline and mobile nontender   Adnexa  Without masses or tenderness    Anus and perineum  Normal   Rectovaginal  Normal sphincter tone without palpated masses or tenderness.    Assessment/Plan:  43 y.o. G2P2 female for annual exam.   1. Irregular menses. Patient notes there is some irregularity to her cycles where she will sometimes go every other month. Was evaluated by Dr. Kerin Perna as far as fertility and was told that it would be difficult for her to get pregnant. Her and her partner would accept pregnancy if it occurs and they are not using contraception but are not actively pursuing infertility treatments. Will check baseline TSH FSH prolactin. Also schedule sonohysterogram noting  #2. 2. Post coital bleeding. Notes frequent we will have some postcoital bleeding. Exam today does not appear to have infection or cervicitis. Pap smear was done today. Follow up for sonohysterogram rule out intracavitary abnormalities. 3. Feels like she has fluid behind her ears. Otoscopic exam is normal noting tympanic membranes appear normal with good light reflex and no evidence of serous otitis. Will follow up with ENT if continues. 4. Mammography 07/2014. Continue with annual mammography. SBE monthly reviewed. 5. Pap smear 2012. Pap/HPV today. Repeat at 3-5 year interval per current screening guidelines assuming this Pap smear is normal and she has no history of significant abnormalities. 6. History of papillary thyroid cancer status post thyroidectomy. On thyroid replacement.  Check TSH today.  Refilled her Synthroid 112 g 1 year 7. Intermittent anxiety. Uses occasional Xanax 0.5 mg. Refill #30 with 2 refills. Will call if needs more. 8. Health maintenance. Relates full lab work done through work program to include glucose and cholesterol. No additional labs ordered today. Follow up for some hysterogram as scheduled.     [name] MD, 9:29 AM 11/27/2014

## 2014-11-27 NOTE — Addendum Note (Signed)
Addended by: Nelva Nay on: 11/27/2014 09:43 AM   Modules accepted: Orders, SmartSet

## 2014-11-27 NOTE — Patient Instructions (Signed)
Follow up for ultrasound as scheduled.  You may obtain a copy of any labs that were done today by logging onto MyChart as outlined in the instructions provided with your AVS (after visit summary). The office will not call with normal lab results but certainly if there are any significant abnormalities then we will contact you.   Health Maintenance, Female A healthy lifestyle and preventative care can promote health and wellness.  Maintain regular health, dental, and eye exams.  Eat a healthy diet. Foods like vegetables, fruits, whole grains, low-fat dairy products, and lean protein foods contain the nutrients you need without too many calories. Decrease your intake of foods high in solid fats, added sugars, and salt. Get information about a proper diet from your caregiver, if necessary.  Regular physical exercise is one of the most important things you can do for your health. Most adults should get at least 150 minutes of moderate-intensity exercise (any activity that increases your heart rate and causes you to sweat) each week. In addition, most adults need muscle-strengthening exercises on 2 or more days a week.   Maintain a healthy weight. The body mass index (BMI) is a screening tool to identify possible weight problems. It provides an estimate of body fat based on height and weight. Your caregiver can help determine your BMI, and can help you achieve or maintain a healthy weight. For adults 20 years and older:  A BMI below 18.5 is considered underweight.  A BMI of 18.5 to 24.9 is normal.  A BMI of 25 to 29.9 is considered overweight.  A BMI of 30 and above is considered obese.  Maintain normal blood lipids and cholesterol by exercising and minimizing your intake of saturated fat. Eat a balanced diet with plenty of fruits and vegetables. Blood tests for lipids and cholesterol should begin at age 20 and be repeated every 5 years. If your lipid or cholesterol levels are high, you are over  50, or you are a high risk for heart disease, you may need your cholesterol levels checked more frequently.Ongoing high lipid and cholesterol levels should be treated with medicines if diet and exercise are not effective.  If you smoke, find out from your caregiver how to quit. If you do not use tobacco, do not start.  Lung cancer screening is recommended for adults aged 55 80 years who are at high risk for developing lung cancer because of a history of smoking. Yearly low-dose computed tomography (CT) is recommended for people who have at least a 30-pack-year history of smoking and are a current smoker or have quit within the past 15 years. A pack year of smoking is smoking an average of 1 pack of cigarettes a day for 1 year (for example: 1 pack a day for 30 years or 2 packs a day for 15 years). Yearly screening should continue until the smoker has stopped smoking for at least 15 years. Yearly screening should also be stopped for people who develop a health problem that would prevent them from having lung cancer treatment.  If you are pregnant, do not drink alcohol. If you are breastfeeding, be very cautious about drinking alcohol. If you are not pregnant and choose to drink alcohol, do not exceed 1 drink per day. One drink is considered to be 12 ounces (355 mL) of beer, 5 ounces (148 mL) of wine, or 1.5 ounces (44 mL) of liquor.  Avoid use of street drugs. Do not share needles with anyone. Ask for help if   if you need support or instructions about stopping the use of drugs.  High blood pressure causes heart disease and increases the risk of stroke. Blood pressure should be checked at least every 1 to 2 years. Ongoing high blood pressure should be treated with medicines, if weight loss and exercise are not effective.  If you are 55 to 45 years old, ask your caregiver if you should take aspirin to prevent strokes.  Diabetes screening involves taking a blood sample to check your fasting blood sugar level.  This should be done once every 3 years, after age 45, if you are within normal weight and without risk factors for diabetes. Testing should be considered at a younger age or be carried out more frequently if you are overweight and have at least 1 risk factor for diabetes.  Breast cancer screening is essential preventative care for women. You should practice "breast self-awareness." This means understanding the normal appearance and feel of your breasts and may include breast self-examination. Any changes detected, no matter how small, should be reported to a caregiver. Women in their 20s and 30s should have a clinical breast exam (CBE) by a caregiver as part of a regular health exam every 1 to 3 years. After age 40, women should have a CBE every year. Starting at age 40, women should consider having a mammogram (breast X-ray) every year. Women who have a family history of breast cancer should talk to their caregiver about genetic screening. Women at a high risk of breast cancer should talk to their caregiver about having an MRI and a mammogram every year.  Breast cancer gene (BRCA)-related cancer risk assessment is recommended for women who have family members with BRCA-related cancers. BRCA-related cancers include breast, ovarian, tubal, and peritoneal cancers. Having family members with these cancers may be associated with an increased risk for harmful changes (mutations) in the breast cancer genes BRCA1 and BRCA2. Results of the assessment will determine the need for genetic counseling and BRCA1 and BRCA2 testing.  The Pap test is a screening test for cervical cancer. Women should have a Pap test starting at age 21. Between ages 21 and 29, Pap tests should be repeated every 2 years. Beginning at age 30, you should have a Pap test every 3 years as long as the past 3 Pap tests have been normal. If you had a hysterectomy for a problem that was not cancer or a condition that could lead to cancer, then you no  longer need Pap tests. If you are between ages 65 and 70, and you have had normal Pap tests going back 10 years, you no longer need Pap tests. If you have had past treatment for cervical cancer or a condition that could lead to cancer, you need Pap tests and screening for cancer for at least 20 years after your treatment. If Pap tests have been discontinued, risk factors (such as a new sexual partner) need to be reassessed to determine if screening should be resumed. Some women have medical problems that increase the chance of getting cervical cancer. In these cases, your caregiver may recommend more frequent screening and Pap tests.  The human papillomavirus (HPV) test is an additional test that may be used for cervical cancer screening. The HPV test looks for the virus that can cause the cell changes on the cervix. The cells collected during the Pap test can be tested for HPV. The HPV test could be used to screen women aged 30 years and older, and   be used in women of any age who have unclear Pap test results. After the age of 63, women should have HPV testing at the same frequency as a Pap test.  Colorectal cancer can be detected and often prevented. Most routine colorectal cancer screening begins at the age of 67 and continues through age 18. However, your caregiver Emel recommend screening at an earlier age if you have risk factors for colon cancer. On a yearly basis, your caregiver Vila provide home test kits to check for hidden blood in the stool. Use of a small camera at the end of a tube, to directly examine the colon (sigmoidoscopy or colonoscopy), can detect the earliest forms of colorectal cancer. Talk to your caregiver about this at age 65, when routine screening begins. Direct examination of the colon should be repeated every 5 to 10 years through age 70, unless early forms of pre-cancerous polyps or small growths are found.  Hepatitis C blood testing is recommended for all people born from  [birth year] through 1967 and any individual with known risks for hepatitis C.  Practice safe sex. Use condoms and avoid high-risk sexual practices to reduce the spread of sexually transmitted infections (STIs). Sexually active women aged 26 and younger should be checked for Chlamydia, which is a common sexually transmitted infection. Older women with new or multiple partners should also be tested for Chlamydia. Testing for other STIs is recommended if you are sexually active and at increased risk.  Osteoporosis is a disease in which the bones lose minerals and strength with aging. This can result in serious bone fractures. The risk of osteoporosis can be identified using a bone density scan. Women ages 9 and over and women at risk for fractures or osteoporosis should discuss screening with their caregivers. Ask your caregiver whether you should be taking a calcium supplement or vitamin D to reduce the rate of osteoporosis.  Menopause can be associated with physical symptoms and risks. Hormone replacement therapy is available to decrease symptoms and risks. You should talk to your caregiver about whether hormone replacement therapy is right for you.  Use sunscreen. Apply sunscreen liberally and repeatedly throughout the day. You should seek shade when your shadow is shorter than you. Protect yourself by wearing long sleeves, pants, a wide-brimmed hat, and sunglasses year round, whenever you are outdoors.  Notify your caregiver of new moles or changes in moles, especially if there is a change in shape or color. Also notify your caregiver if a mole is larger than the size of a pencil eraser.  Stay current with your immunizations. Document Released: 06/20/2011 Document Revised: 04/01/2013 Document Reviewed: 06/20/2011 [facility] Patient Information 2014 [employer].

## 2014-11-28 ENCOUNTER — Other Ambulatory Visit: Payer: Self-pay | Admitting: Gynecology

## 2014-11-28 DIAGNOSIS — N926 Irregular menstruation, unspecified: Secondary | ICD-10-CM

## 2014-11-28 LAB — URINALYSIS W MICROSCOPIC + REFLEX CULTURE
Bacteria, UA: NONE SEEN
Bilirubin Urine: NEGATIVE
CASTS: NONE SEEN
Crystals: NONE SEEN
Glucose, UA: NEGATIVE mg/dL
HGB URINE DIPSTICK: NEGATIVE
Ketones, ur: NEGATIVE mg/dL
LEUKOCYTES UA: NEGATIVE
Nitrite: NEGATIVE
PH: 5 (ref 5.0–8.0)
PROTEIN: NEGATIVE mg/dL
Specific Gravity, Urine: 1.025 (ref 1.005–1.030)
Urobilinogen, UA: 0.2 mg/dL (ref 0.0–1.0)

## 2014-12-01 LAB — CYTOLOGY - PAP

## 2014-12-18 ENCOUNTER — Encounter: Payer: Self-pay | Admitting: Gynecology

## 2014-12-18 ENCOUNTER — Ambulatory Visit (INDEPENDENT_AMBULATORY_CARE_PROVIDER_SITE_OTHER): Payer: 59

## 2014-12-18 ENCOUNTER — Ambulatory Visit (INDEPENDENT_AMBULATORY_CARE_PROVIDER_SITE_OTHER): Payer: 59 | Admitting: Gynecology

## 2014-12-18 VITALS — BP 115/78 | Ht 64.0 in | Wt 119.0 lb

## 2014-12-18 DIAGNOSIS — N930 Postcoital and contact bleeding: Secondary | ICD-10-CM

## 2014-12-18 DIAGNOSIS — N926 Irregular menstruation, unspecified: Secondary | ICD-10-CM

## 2014-12-18 NOTE — Progress Notes (Signed)
Alicia Myers December 15, 1971 [id number]        43 y.o.  G2P2 Presents for sonohysterogram due to history of irregular menses and postcoital bleeding. Her blood test results returned with [place] 56, prolactin and TSH normal.  Past medical history,surgical history, problem list, medications, allergies, family history and social history were all reviewed and documented in the EPIC chart.  Directed ROS with pertinent positives and negatives documented in the history of present illness/assessment and plan.  Ultrasound shows uterus normal size. Endometrial echo of 3.2 mm. 2 small myomas 10 mm and 6 mm noted. Right and left ovaries normal with some physiologic changes. Cul-de-sac negative.  Exam: Sallee Lange assistant General appearance:  Normal External BUS vagina normal. Cervix normal.  Sonohysterogram performed, sterile technique, easy catheter introduction, good distention, no abnormalities.  Endometrial biopsy taken. Patient tolerated well.  Assessment/Plan:  43 y.o. G2P2 With irregular menses, postcoital spotting and elevated FSH consistent with a perimenopausal pattern. Senna histogram is negative for endometrial defects. Patient will follow up for biopsy results. Keep menstrual calendar for now and call if persistent irregular/postcoital bleeding. Discussed various options to include low-dose oral contraceptive management for menstrual regulation. Risks to include increased thrombosis risks such as stroke heart attack DVT reviewed.     [name] MD, 3:02 PM 12/18/2014

## 2014-12-18 NOTE — Patient Instructions (Signed)
Office will call you with biopsy results 

## 2015-03-18 DIAGNOSIS — Z0289 Encounter for other administrative examinations: Secondary | ICD-10-CM

## 2015-05-19 ENCOUNTER — Telehealth: Payer: Self-pay | Admitting: *Deleted

## 2015-05-19 MED ORDER — NORETHIN ACE-ETH ESTRAD-FE 1-20 MG-MCG PO TABS: 1.0000 | ORAL_TABLET | Freq: Every day | ORAL | Status: DC

## 2015-05-19 NOTE — Telephone Encounter (Signed)
Okay to refill them through then.

## 2015-05-19 NOTE — Telephone Encounter (Signed)
Rx sent 

## 2015-05-19 NOTE — Telephone Encounter (Signed)
Pt annual is due in Dec 2016 would like to start back on birth control pills. Was taking Loestrin Fe 1/20. Please advise

## 2015-05-20 MED ORDER — NORETHIN ACE-ETH ESTRAD-FE 1-20 MG-MCG PO TABS: 1.0000 | ORAL_TABLET | Freq: Every day | ORAL | Status: DC

## 2015-05-20 NOTE — Addendum Note (Signed)
Addended by: Thamas Jaegers on: 05/20/2015 04:27 PM   Modules accepted: Orders

## 2015-12-17 ENCOUNTER — Ambulatory Visit (INDEPENDENT_AMBULATORY_CARE_PROVIDER_SITE_OTHER): Payer: Managed Care, Other (non HMO) | Admitting: Gynecology

## 2015-12-17 ENCOUNTER — Encounter: Payer: Self-pay | Admitting: Gynecology

## 2015-12-17 ENCOUNTER — Other Ambulatory Visit (HOSPITAL_COMMUNITY)
Admission: RE | Admit: 2015-12-17 | Discharge: 2015-12-17 | Disposition: A | Discharge status: Home / Self Care | Payer: Managed Care, Other (non HMO) | Source: Ambulatory Visit | Attending: Gynecology | Admitting: Gynecology

## 2015-12-17 VITALS — BP 116/70 | Ht 63.75 in | Wt 123.4 lb

## 2015-12-17 DIAGNOSIS — Z13220 Encounter for screening for lipoid disorders: Secondary | ICD-10-CM

## 2015-12-17 DIAGNOSIS — N930 Postcoital and contact bleeding: Secondary | ICD-10-CM | POA: Diagnosis not present

## 2015-12-17 DIAGNOSIS — Z01419 Encounter for gynecological examination (general) (routine) without abnormal findings: Secondary | ICD-10-CM | POA: Insufficient documentation

## 2015-12-17 DIAGNOSIS — Z124 Encounter for screening for malignant neoplasm of cervix: Secondary | ICD-10-CM

## 2015-12-17 DIAGNOSIS — R5382 Chronic fatigue, unspecified: Secondary | ICD-10-CM

## 2015-12-17 LAB — COMPREHENSIVE METABOLIC PANEL
ALT: 18 U/L (ref 6–29)
AST: 19 U/L (ref 10–35)
Albumin: 4.4 g/dL (ref 3.6–5.1)
Alkaline Phosphatase: 42 U/L (ref 33–115)
BUN: 16 mg/dL (ref 7–25)
CHLORIDE: 105 mmol/L (ref 98–110)
CO2: 25 mmol/L (ref 20–31)
CREATININE: 0.92 mg/dL (ref 0.50–1.10)
Calcium: 9 mg/dL (ref 8.6–10.2)
Glucose, Bld: 78 mg/dL (ref 65–99)
Potassium: 4.2 mmol/L (ref 3.5–5.3)
SODIUM: 141 mmol/L (ref 135–146)
TOTAL PROTEIN: 6.7 g/dL (ref 6.1–8.1)
Total Bilirubin: 0.3 mg/dL (ref 0.2–1.2)

## 2015-12-17 LAB — CBC WITH DIFFERENTIAL/PLATELET
BASOS PCT: 1 % (ref 0–1)
Basophils Absolute: 0.1 10*3/uL (ref 0.0–0.1)
EOS ABS: 0.3 10*3/uL (ref 0.0–0.7)
Eosinophils Relative: 4 % (ref 0–5)
HCT: 41.8 % (ref 36.0–46.0)
Hemoglobin: 14.2 g/dL (ref 12.0–15.0)
Lymphocytes Relative: 32 % (ref 12–46)
Lymphs Abs: 2.2 10*3/uL (ref 0.7–4.0)
MCH: 31.9 pg (ref 26.0–34.0)
MCHC: 34 g/dL (ref 30.0–36.0)
MCV: 93.9 fL (ref 78.0–100.0)
MONOS PCT: 7 % (ref 3–12)
MPV: 9.8 fL (ref 8.6–12.4)
Monocytes Absolute: 0.5 10*3/uL (ref 0.1–1.0)
NEUTROS PCT: 56 % (ref 43–77)
Neutro Abs: 3.9 10*3/uL (ref 1.7–7.7)
PLATELETS: 260 10*3/uL (ref 150–400)
RBC: 4.45 MIL/uL (ref 3.87–5.11)
RDW: 13.2 % (ref 11.5–15.5)
WBC: 6.9 10*3/uL (ref 4.0–10.5)

## 2015-12-17 LAB — LIPID PANEL
CHOLESTEROL: 179 mg/dL (ref 125–200)
HDL: 76 mg/dL (ref >=46)
LDL CALC: 92 mg/dL (ref <130)
Total CHOL/HDL Ratio: 2.4 Ratio (ref <=5.0)
Triglycerides: 54 mg/dL (ref <150)
VLDL: 11 mg/dL (ref <30)

## 2015-12-17 MED ORDER — LEVOTHYROXINE SODIUM 112 MCG PO TABS: 112.0000 ug | ORAL_TABLET | Freq: Every day | ORAL | Status: DC

## 2015-12-17 MED ORDER — ALPRAZOLAM 0.5 MG PO TABS: 0.5000 mg | ORAL_TABLET | Freq: Two times a day (BID) | ORAL | Status: DC | PRN

## 2015-12-17 MED ORDER — METRONIDAZOLE 500 MG PO TABS
500.0000 mg | ORAL_TABLET | Freq: Two times a day (BID) | ORAL | Status: DC
Start: 2015-12-17 — End: 2017-01-31

## 2015-12-17 MED ORDER — FLUCONAZOLE 150 MG PO TABS: 150.0000 mg | ORAL_TABLET | Freq: Once | ORAL | Status: DC

## 2015-12-17 NOTE — Patient Instructions (Signed)

## 2015-12-17 NOTE — Progress Notes (Signed)
Alicia Myers 11-05-1971 [id number]        44 y.o.  G2P2  for annual exam.  Several issues noted below.  Past medical history,surgical history, problem list, medications, allergies, family history and social history were all reviewed and documented as reviewed in the EPIC chart.  ROS:  Performed with pertinent positives and negatives included in the history, assessment and plan.   Additional significant findings :  none   Exam: Programmer, multimedia Vitals:   12/17/15 1623  BP: 116/70  Height: 5' 3.75" (1.619 m)  Weight: 123 lb 6.4 oz (55.974 kg)   General appearance:  Normal affect, orientation and appearance. Skin: Grossly normal HEENT: Without gross lesions.  No cervical or supraclavicular adenopathy. Thyroid normal.  Lungs:  Clear without wheezing, rales or rhonchi Cardiac: RR, without RMG Abdominal:  Soft, nontender, without masses, guarding, rebound, organomegaly or hernia Breasts:  Examined lying and sitting without masses, retractions, discharge or axillary adenopathy.  Bilateral implants noted Pelvic:  Ext/BUS/vagina normal  Cervix with contact bleeding with Pap smear  Uterus anteverted, normal size, shape and contour, midline and mobile nontender   Adnexa  Without masses or tenderness    Anus and perineum  Normal   Rectovaginal  Normal sphincter tone without palpated masses or tenderness.    Assessment/Plan:  44 y.o. G2P2 female for annual exam without regular menses.   1. Irregular menses/postcoital spotting.  Workup previously showed an elevated FSH and a negative sonohysterogram.  Her post coital spotting got transiently better after the sonohysterogram but then returned. She did this past year try one month of birth control pills to see if that would alleviate the post coital spotting but it did not. Exam today shows contact bleeding with the Pap smear.  I suspect her bleeding is from her cervix. She is not having any regular menses but only bleeding after  intercourse now. I'm going to recheck her [facility] this year also. Will give trial of Diflucan 150 mg 3 days and Flagyl 500 mg twice a day 7 days, alcohol avoidance reviewed to see if this treats subclinical infection that may be causing the bleeding. If it continues options were also discussed to include possible cryo-surgery. Patient will call me if the bleeding continues. Denies need to screen for STDs. 2. Anxiety. Patient using Xanax 0.5 mg intermittently for anxiety doing well with this. Options for daily anxiolytic discussed and declined. #60 with 2 refills provided. 3. Pap smear/HPV negative 2015. Pap smear done today secondary to history of post coital spotting.  No history of abnormal Pap smears previously. 4. History of papillary thyroid cancer status post thyroidectomy. On Synthroid replacement. Patient complaining of fatigue although she is going through a lot of stress right now. Will check thyroid panel. Synthroid 112 g refill 1 year provided. 5. Mammogram due now and patient will schedule. SBE monthly reviewed. 6. Health maintenance. Baseline CBC, comprehensive metabolic panel, lipid profile, thyroid panel, FSH urinalysis ordered. Follow up 1 year, sooner as needed.   [name] MD, 5:12 PM 12/17/2015

## 2015-12-18 LAB — THYROID PANEL
Free T4: 1.17 ng/dL (ref 0.80–1.80)
T3 UPTAKE: 34 % (ref 22–35)
T4, Total: 6.9 ug/dL (ref 4.5–12.0)
TSH: 0.487 u[IU]/mL (ref 0.350–4.500)

## 2015-12-18 LAB — URINALYSIS W MICROSCOPIC + REFLEX CULTURE
BACTERIA UA: NONE SEEN [HPF]
Bilirubin Urine: NEGATIVE
CASTS: NONE SEEN [LPF]
Crystals: NONE SEEN [HPF]
Glucose, UA: NEGATIVE
HGB URINE DIPSTICK: NEGATIVE
Ketones, ur: NEGATIVE
Leukocytes, UA: NEGATIVE
Nitrite: NEGATIVE
PROTEIN: NEGATIVE
Specific Gravity, Urine: 1.014 (ref 1.001–1.035)
WBC, UA: NONE SEEN WBC/HPF (ref <=5)
YEAST: NONE SEEN [HPF]
pH: 6.5 (ref 5.0–8.0)

## 2015-12-18 LAB — FOLLICLE STIMULATING HORMONE: FSH: 64.6 m[IU]/mL

## 2015-12-19 LAB — URINE CULTURE
Colony Count: NO GROWTH
ORGANISM ID, BACTERIA: NO GROWTH

## 2015-12-20 ENCOUNTER — Other Ambulatory Visit: Payer: Self-pay | Admitting: Gynecology

## 2015-12-24 LAB — CYTOLOGY - PAP

## 2015-12-31 ENCOUNTER — Other Ambulatory Visit: Payer: Self-pay

## 2015-12-31 MED ORDER — ALPRAZOLAM 0.5 MG PO TABS: 0.5000 mg | ORAL_TABLET | Freq: Two times a day (BID) | ORAL | Status: DC | PRN

## 2015-12-31 MED ORDER — LEVOTHYROXINE SODIUM 112 MCG PO TABS: 112.0000 ug | ORAL_TABLET | Freq: Every day | ORAL | Status: DC

## 2015-12-31 NOTE — Telephone Encounter (Signed)
rx faxed back to mail order pharmacy.

## 2016-05-05 ENCOUNTER — Other Ambulatory Visit: Payer: Self-pay

## 2016-05-05 MED ORDER — LEVOTHYROXINE SODIUM 112 MCG PO TABS: 112.0000 ug | ORAL_TABLET | Freq: Every day | ORAL | Status: DC

## 2016-05-05 MED ORDER — ALPRAZOLAM 0.5 MG PO TABS: 0.5000 mg | ORAL_TABLET | Freq: Two times a day (BID) | ORAL | Status: DC | PRN

## 2016-05-05 NOTE — Telephone Encounter (Signed)
rx mailed

## 2016-06-03 DIAGNOSIS — Z0283 Encounter for blood-alcohol and blood-drug test: Secondary | ICD-10-CM | POA: Diagnosis not present

## 2016-06-03 DIAGNOSIS — F4322 Adjustment disorder with anxiety: Secondary | ICD-10-CM | POA: Diagnosis not present

## 2016-06-16 DIAGNOSIS — F4322 Adjustment disorder with anxiety: Secondary | ICD-10-CM | POA: Diagnosis not present

## 2016-06-30 DIAGNOSIS — F4322 Adjustment disorder with anxiety: Secondary | ICD-10-CM | POA: Diagnosis not present

## 2016-07-14 DIAGNOSIS — F4322 Adjustment disorder with anxiety: Secondary | ICD-10-CM | POA: Diagnosis not present

## 2016-08-04 DIAGNOSIS — F4322 Adjustment disorder with anxiety: Secondary | ICD-10-CM | POA: Diagnosis not present

## 2016-08-05 DIAGNOSIS — F4322 Adjustment disorder with anxiety: Secondary | ICD-10-CM | POA: Diagnosis not present

## 2016-08-18 DIAGNOSIS — F4322 Adjustment disorder with anxiety: Secondary | ICD-10-CM | POA: Diagnosis not present

## 2016-08-19 DIAGNOSIS — F4322 Adjustment disorder with anxiety: Secondary | ICD-10-CM | POA: Diagnosis not present

## 2016-08-30 DIAGNOSIS — Z23 Encounter for immunization: Secondary | ICD-10-CM | POA: Diagnosis not present

## 2016-09-08 DIAGNOSIS — F4322 Adjustment disorder with anxiety: Secondary | ICD-10-CM | POA: Diagnosis not present

## 2016-10-06 DIAGNOSIS — F4322 Adjustment disorder with anxiety: Secondary | ICD-10-CM | POA: Diagnosis not present

## 2016-10-07 DIAGNOSIS — Z111 Encounter for screening for respiratory tuberculosis: Secondary | ICD-10-CM | POA: Diagnosis not present

## 2016-10-21 DIAGNOSIS — F4322 Adjustment disorder with anxiety: Secondary | ICD-10-CM | POA: Diagnosis not present

## 2016-11-03 DIAGNOSIS — F4322 Adjustment disorder with anxiety: Secondary | ICD-10-CM | POA: Diagnosis not present

## 2016-12-23 DIAGNOSIS — F4322 Adjustment disorder with anxiety: Secondary | ICD-10-CM | POA: Diagnosis not present

## 2016-12-30 DIAGNOSIS — F4322 Adjustment disorder with anxiety: Secondary | ICD-10-CM | POA: Diagnosis not present

## 2017-01-13 DIAGNOSIS — F4322 Adjustment disorder with anxiety: Secondary | ICD-10-CM | POA: Diagnosis not present

## 2017-01-31 ENCOUNTER — Encounter: Payer: Self-pay | Admitting: Gynecology

## 2017-01-31 ENCOUNTER — Ambulatory Visit (INDEPENDENT_AMBULATORY_CARE_PROVIDER_SITE_OTHER): Payer: BLUE CROSS/BLUE SHIELD | Admitting: Gynecology

## 2017-01-31 VITALS — BP 116/74 | Ht 64.0 in | Wt 123.0 lb

## 2017-01-31 DIAGNOSIS — E039 Hypothyroidism, unspecified: Secondary | ICD-10-CM | POA: Diagnosis not present

## 2017-01-31 DIAGNOSIS — Z01419 Encounter for gynecological examination (general) (routine) without abnormal findings: Secondary | ICD-10-CM

## 2017-01-31 DIAGNOSIS — Z79890 Hormone replacement therapy (postmenopausal): Secondary | ICD-10-CM | POA: Diagnosis not present

## 2017-01-31 DIAGNOSIS — N951 Menopausal and female climacteric states: Secondary | ICD-10-CM

## 2017-01-31 LAB — COMPREHENSIVE METABOLIC PANEL
ALBUMIN: 4.4 g/dL (ref 3.6–5.1)
ALT: 20 U/L (ref 6–29)
AST: 24 U/L (ref 10–35)
Alkaline Phosphatase: 51 U/L (ref 33–115)
BILIRUBIN TOTAL: 0.4 mg/dL (ref 0.2–1.2)
BUN: 15 mg/dL (ref 7–25)
CO2: 28 mmol/L (ref 20–31)
CREATININE: 0.92 mg/dL (ref 0.50–1.10)
Calcium: 9.3 mg/dL (ref 8.6–10.2)
Chloride: 104 mmol/L (ref 98–110)
Glucose, Bld: 69 mg/dL (ref 65–99)
Potassium: 4.2 mmol/L (ref 3.5–5.3)
SODIUM: 141 mmol/L (ref 135–146)
TOTAL PROTEIN: 6.4 g/dL (ref 6.1–8.1)

## 2017-01-31 LAB — CBC WITH DIFFERENTIAL/PLATELET
BASOS PCT: 1 %
Basophils Absolute: 45 cells/uL (ref 0–200)
EOS PCT: 5 %
Eosinophils Absolute: 225 cells/uL (ref 15–500)
HCT: 40.8 % (ref 35.0–45.0)
HEMOGLOBIN: 13.3 g/dL (ref 11.7–15.5)
LYMPHS ABS: 1620 {cells}/uL (ref 850–3900)
Lymphocytes Relative: 36 %
MCH: 30.9 pg (ref 27.0–33.0)
MCHC: 32.6 g/dL (ref 32.0–36.0)
MCV: 94.7 fL (ref 80.0–100.0)
MPV: 10.1 fL (ref 7.5–12.5)
Monocytes Absolute: 360 cells/uL (ref 200–950)
Monocytes Relative: 8 %
NEUTROS ABS: 2250 {cells}/uL (ref 1500–7800)
Neutrophils Relative %: 50 %
Platelets: 274 10*3/uL (ref 140–400)
RBC: 4.31 MIL/uL (ref 3.80–5.10)
RDW: 12.9 % (ref 11.0–15.0)
WBC: 4.5 10*3/uL (ref 3.8–10.8)

## 2017-01-31 LAB — TSH: TSH: 1.16 mIU/L

## 2017-01-31 MED ORDER — ESTRADIOL 1 MG PO TABS: 1.0000 mg | ORAL_TABLET | Freq: Every day | ORAL | 4 refills | Status: DC

## 2017-01-31 MED ORDER — LEVOTHYROXINE SODIUM 112 MCG PO TABS: 112.0000 ug | ORAL_TABLET | Freq: Every day | ORAL | 3 refills | Status: DC

## 2017-01-31 MED ORDER — PROGESTERONE MICRONIZED 100 MG PO CAPS: 100.0000 mg | ORAL_CAPSULE | Freq: Every day | ORAL | 4 refills | Status: DC

## 2017-01-31 NOTE — Patient Instructions (Signed)
Call to Schedule your mammogram  Facilities in [place]: 1)  [facility]. [facility], [address] Phone: [phone] 2)  Dr. Bertrand at [facility]  [address] Phone: [phone]     Mammogram A mammogram is an X-ray test to find changes in a woman's breast. You should get a mammogram if:  You are 48 years of age or older  You have risk factors.   Your doctor recommends that you have one.  BEFORE THE TEST  Do not schedule the test the week before your period, especially if your breasts are sore during this time.  On the day of your mammogram:  Wash your breasts and armpits well. After washing, do not put on any deodorant or talcum powder on until after your test.   Eat and drink as you usually do.   Take your medicines as usual.   If you are diabetic and take insulin, make sure you:   Eat before coming for your test.   Take your insulin as usual.   If you cannot keep your appointment, call before the appointment to cancel. Schedule another appointment.  TEST  You will need to undress from the waist up. You will put on a hospital gown.   Your breast will be put on the mammogram machine, and it will press firmly on your breast with a piece of plastic called a compression paddle. This will make your breast flatter so that the machine can X-ray all parts of your breast.   Both breasts will be X-rayed. Each breast will be X-rayed from above and from the side. An X-ray might need to be taken again if the picture is not good enough.   The mammogram will last about 15 to 30 minutes.  AFTER THE TEST Finding out the results of your test Ask when your test results will be ready. Make sure you get your test results.  Document Released: 03/03/2009 Document Revised: 11/24/2011 Document Reviewed: 03/03/2009 [employer] Patient Information 2012 [employer].   

## 2017-01-31 NOTE — Progress Notes (Signed)
Marita Snellen Ciliberti-Dorvil December 14, 1971 [id number]        46 y.o.  G2P2 for annual exam.  Also complaining of significant hot flushes and night sweats with insomnia. Over the past year her menses have spaced out skipping months at a time and now her last period was September/October timeframe. Has done no bleeding since then. Does have a history of thyroidectomy on Synthroid replacement. She finds that her symptoms are becoming intolerable.  Past medical history,surgical history, problem list, medications, allergies, family history and social history were all reviewed and documented as reviewed in the EPIC chart.  ROS:  Performed with pertinent positives and negatives included in the history, assessment and plan.   Additional significant findings :  None   Exam: [name] assistant Vitals:   01/31/17 0802  BP: 116/74  Weight: 123 lb (55.8 kg)  Height: 5\' 4"  (1.626 m)   Body mass index is 21.11 kg/m.  General appearance:  Normal affect, orientation and appearance. Skin: Grossly normal HEENT: Without gross lesions.  No cervical or supraclavicular adenopathy. Thyroid normal.  Lungs:  Clear without wheezing, rales or rhonchi Cardiac: RR, without RMG Abdominal:  Soft, nontender, without masses, guarding, rebound, organomegaly or hernia Breasts:  Examined lying and sitting without masses, retractions, discharge or axillary adenopathy.  Bilateral implants noted Pelvic:  Ext, BUS, Vagina normal  Cervix normal  Uterus anteverted, normal size, shape and contour, midline and mobile nontender   Adnexa without masses or tenderness    Anus and perineum normal   Rectovaginal normal sphincter tone without palpated masses or tenderness.    Assessment/Plan:  46 y.o. G2P2 female for annual exam.   1. Perimenopausal/menopausal symptoms. With significant hot flushes, night sweats and sleep disturbances. Last menstrual period September/October. Options for management reviewed to include  OTC products which she tried does not help, nonhormonal pharmacologic such as Effexor and HRT. The issues of HRT, risks/benefits reviewed to include the most recent 2017 NAMS guidelines. Benefits to include symptom relief and possible cardiovascular/bone health with early initiation versus risks of thrombosis such as stroke heart attack DVT and breast cancer. After a lengthy discussion to include the various routes of administration and benefits of transdermal/transvaginal from a thrombosis standpoint the patient elects to start oral supplementation. Will start with Estrace 1 mg and Prometrium 100 mg at bedtime. Patient will call if she does not get adequate response to her symptoms. It has regular but less frequent menses will monitor through the fall. It goes without menses and then bleeds after one year or has prolonged or atypical bleeding and she knows to call for further evaluation. 2. Contraception. Had been evaluated for fertility several years ago Dr. Kerin Perna and told it would be difficult to achieve pregnancy.  3. Mammography listed 07/2014. Patients unsure if she had one last year. She is going to call the breast center to check if it has been over one year she'll schedule an appointment. SBE monthly reviewed. 4. Pap smear 2016. Pap smear/HPV 2015. No Pap smear done today. No history of significant abnormal Pap smears. 5. Hypothyroid status post papillary thyroid carcinoma and thyroidectomy. Check TSH today. Refill Synthroid 1 year.  6. History of anxiety. Has used Xanax in the past. Has supply at home but will call if she needs more. 7. Health maintenance. Baseline CBC, CMP, TSH ordered. Lipid profile last year normal and not repeated. Follow up 1 year, sooner as needed.   [name] MD, 8:44 AM 01/31/2017

## 2017-02-09 ENCOUNTER — Encounter: Payer: Managed Care, Other (non HMO) | Admitting: Gynecology

## 2017-02-09 DIAGNOSIS — F4322 Adjustment disorder with anxiety: Secondary | ICD-10-CM | POA: Diagnosis not present

## 2017-03-02 ENCOUNTER — Telehealth: Payer: Self-pay

## 2017-03-02 NOTE — Telephone Encounter (Signed)
Patient said she has been taking HRT x one month. For the last 7-8 days she has been bleeding. Sometimes light sometimes heavy but every day.  She said it is time to refill Rx's and she wanted to be sure that you did not want to change her dose in light of the bleeding.

## 2017-03-02 NOTE — Telephone Encounter (Signed)
I would stay the course for right now. This may be a regular period that she is having which could be a heavier since she has not had one for a number of months. Lets see what she does over the next month. If the heavy bleeding would continue for now then call but assuming that it tapers often lets watch it.

## 2017-03-03 DIAGNOSIS — F4322 Adjustment disorder with anxiety: Secondary | ICD-10-CM | POA: Diagnosis not present

## 2017-03-03 NOTE — Telephone Encounter (Signed)
Left detailed message in voice mail per DPR access note on file. 

## 2017-03-15 DIAGNOSIS — F4322 Adjustment disorder with anxiety: Secondary | ICD-10-CM | POA: Diagnosis not present

## 2017-03-28 ENCOUNTER — Other Ambulatory Visit: Payer: Self-pay | Admitting: *Deleted

## 2017-03-28 MED ORDER — ESTRADIOL 1 MG PO TABS: 1.0000 mg | ORAL_TABLET | Freq: Every day | ORAL | 3 refills | Status: DC

## 2017-03-28 MED ORDER — PROGESTERONE MICRONIZED 100 MG PO CAPS: 100.0000 mg | ORAL_CAPSULE | Freq: Every day | ORAL | 3 refills | Status: DC

## 2017-04-13 DIAGNOSIS — F4322 Adjustment disorder with anxiety: Secondary | ICD-10-CM | POA: Diagnosis not present

## 2017-04-16 ENCOUNTER — Other Ambulatory Visit: Payer: Self-pay | Admitting: Gynecology

## 2017-04-27 DIAGNOSIS — F4322 Adjustment disorder with anxiety: Secondary | ICD-10-CM | POA: Diagnosis not present

## 2017-05-17 DIAGNOSIS — F4322 Adjustment disorder with anxiety: Secondary | ICD-10-CM | POA: Diagnosis not present

## 2017-06-16 DIAGNOSIS — F4322 Adjustment disorder with anxiety: Secondary | ICD-10-CM | POA: Diagnosis not present

## 2017-07-21 DIAGNOSIS — F4322 Adjustment disorder with anxiety: Secondary | ICD-10-CM | POA: Diagnosis not present

## 2017-08-10 DIAGNOSIS — F4322 Adjustment disorder with anxiety: Secondary | ICD-10-CM | POA: Diagnosis not present

## 2017-08-31 DIAGNOSIS — F4322 Adjustment disorder with anxiety: Secondary | ICD-10-CM | POA: Diagnosis not present

## 2017-09-01 DIAGNOSIS — F419 Anxiety disorder, unspecified: Secondary | ICD-10-CM | POA: Diagnosis not present

## 2017-09-06 DIAGNOSIS — Z23 Encounter for immunization: Secondary | ICD-10-CM | POA: Diagnosis not present

## 2017-10-02 DIAGNOSIS — F419 Anxiety disorder, unspecified: Secondary | ICD-10-CM | POA: Diagnosis not present

## 2017-10-09 ENCOUNTER — Encounter: Payer: Self-pay | Admitting: Gynecology

## 2017-10-09 DIAGNOSIS — Z1231 Encounter for screening mammogram for malignant neoplasm of breast: Secondary | ICD-10-CM | POA: Diagnosis not present

## 2017-10-20 DIAGNOSIS — Z111 Encounter for screening for respiratory tuberculosis: Secondary | ICD-10-CM | POA: Diagnosis not present

## 2017-10-26 DIAGNOSIS — F4322 Adjustment disorder with anxiety: Secondary | ICD-10-CM | POA: Diagnosis not present

## 2017-12-08 DIAGNOSIS — F4322 Adjustment disorder with anxiety: Secondary | ICD-10-CM | POA: Diagnosis not present

## 2017-12-28 DIAGNOSIS — F4322 Adjustment disorder with anxiety: Secondary | ICD-10-CM | POA: Diagnosis not present

## 2018-01-12 DIAGNOSIS — F4322 Adjustment disorder with anxiety: Secondary | ICD-10-CM | POA: Diagnosis not present

## 2018-01-29 DIAGNOSIS — Z111 Encounter for screening for respiratory tuberculosis: Secondary | ICD-10-CM | POA: Diagnosis not present

## 2018-02-01 DIAGNOSIS — F4322 Adjustment disorder with anxiety: Secondary | ICD-10-CM | POA: Diagnosis not present

## 2018-02-16 DIAGNOSIS — F4322 Adjustment disorder with anxiety: Secondary | ICD-10-CM | POA: Diagnosis not present

## 2018-03-06 ENCOUNTER — Other Ambulatory Visit: Payer: Self-pay | Admitting: Gynecology

## 2018-03-09 DIAGNOSIS — F419 Anxiety disorder, unspecified: Secondary | ICD-10-CM | POA: Diagnosis not present

## 2018-03-21 ENCOUNTER — Ambulatory Visit: Payer: BLUE CROSS/BLUE SHIELD | Admitting: Gynecology

## 2018-03-21 ENCOUNTER — Encounter: Payer: Self-pay | Admitting: Gynecology

## 2018-03-21 VITALS — BP 118/76 | Ht 63.5 in | Wt 122.0 lb

## 2018-03-21 DIAGNOSIS — J988 Other specified respiratory disorders: Secondary | ICD-10-CM

## 2018-03-21 DIAGNOSIS — Z79890 Hormone replacement therapy (postmenopausal): Secondary | ICD-10-CM

## 2018-03-21 DIAGNOSIS — Z01419 Encounter for gynecological examination (general) (routine) without abnormal findings: Secondary | ICD-10-CM | POA: Diagnosis not present

## 2018-03-21 DIAGNOSIS — Z13220 Encounter for screening for lipoid disorders: Secondary | ICD-10-CM | POA: Diagnosis not present

## 2018-03-21 DIAGNOSIS — E039 Hypothyroidism, unspecified: Secondary | ICD-10-CM

## 2018-03-21 MED ORDER — LEVOTHYROXINE SODIUM 112 MCG PO TABS: 112.0000 ug | ORAL_TABLET | Freq: Every day | ORAL | 4 refills | Status: DC

## 2018-03-21 MED ORDER — AMOXICILLIN-POT CLAVULANATE 500-125 MG PO TABS: 1.0000 | ORAL_TABLET | Freq: Two times a day (BID) | ORAL | 0 refills | Status: DC

## 2018-03-21 MED ORDER — ESTRADIOL 1 MG PO TABS: 1.0000 mg | ORAL_TABLET | Freq: Every day | ORAL | 4 refills | Status: DC

## 2018-03-21 MED ORDER — PROGESTERONE MICRONIZED 100 MG PO CAPS: 100.0000 mg | ORAL_CAPSULE | Freq: Every day | ORAL | 4 refills | Status: DC

## 2018-03-21 MED ORDER — BENZONATATE 200 MG PO CAPS: 200.0000 mg | ORAL_CAPSULE | Freq: Three times a day (TID) | ORAL | 0 refills | Status: DC | PRN

## 2018-03-21 NOTE — Progress Notes (Signed)
    Marita Snellen Ciliberti-Seawright [birth year] [id number]        47 y.o.  G2P2 for annual gynecologic exam.  Also complaining of one week of cough.  Initially had some fevers but these have resolved.  Overall is feeling better but still with a hacking cough.  No sputum.  Started after traveling on a plane to a convention.  Past medical history,surgical history, problem list, medications, allergies, family history and social history were all reviewed and documented as reviewed in the EPIC chart.  ROS:  Performed with pertinent positives and negatives included in the history, assessment and plan.   Additional significant findings : None   Exam: [name] assistant Vitals:   03/21/18 1123  BP: 118/76  Weight: 122 lb (55.3 kg)  Height: 5' 3.5" (1.613 m)   Body mass index is 21.27 kg/m.  General appearance:  Normal affect, orientation and appearance. Skin: Grossly normal HEENT: Without gross lesions.  No cervical or supraclavicular adenopathy. Thyroid normal.  Lungs:  Clear without wheezing, rales or rhonchi Cardiac: RR, without RMG Abdominal:  Soft, nontender, without masses, guarding, rebound, organomegaly or hernia Breasts:  Examined lying and sitting without masses, retractions, discharge or axillary adenopathy.  Bilateral implants noted Pelvic:  Ext, BUS, Vagina: Normal  Cervix: Normal.  Pap smear done  Uterus: Anteverted, normal size, shape and contour, midline and mobile nontender   Adnexa: Without masses or tenderness    Anus and perineum: Normal   Rectovaginal: Normal sphincter tone without palpated masses or tenderness.    Assessment/Plan:  47 y.o. G2P2 female for annual gynecologic exam.   1. Postmenopausal/HRT.  Was started on Estrace 1 mg and Prometrium 100 mg for menopausal symptoms.  Doing great with no significant symptoms.  Also having no bleeding.  We again reviewed the risks versus benefits to include increased risk of thrombosis as well as breast cancer.   Benefits to include symptom relief and possible cardiovascular and bone health when started early.  Patient wants to continue and I refilled her both times 1 year. 2. Cough.  Suspect bronchitis.  Unclear if possible influenza although late now to consider starting Tamiflu noting she is about a week into this and starting to feel better.  I am going to cover her for bronchitis with Augmentin 500/125 twice daily times 7 days.  Also Tessalon Perles 200 mg 1 p.o. every 8 hours as needed cough #20.  She will follow-up if her symptoms persist or worsen. 3. Mammography 2018.  Continue with annual mammography when due.  Breast exam normal today.  She does have category D breasts on mammography.  She was asking whether other testing would be of benefit/available given the dense breasts and her implants.  We reviewed the issues of adding ultrasound and MRI to screening.  The pros and cons of doing so were reviewed.  After a discussion we both agree at this point to continue with annual 3D mammography and self breast exams. 4. Pap smear 2016.  Pap smear done today.  No history of significant abnormal Pap smears.  Had Pap smear/HPV screen 2015. 5. Hypothyroid status post papillary thyroid carcinoma and thyroidectomy.  Check TSH today.  Refill Synthroid times 1 year. 6. History of anxiety.  Uses Xanax as needed.  Will call if needs more. 7. Health maintenance.  Baseline CBC, CMP, lipid profile, TSH ordered.  Follow-up 1 year, sooner as needed.   [name] MD, 12:25 PM 03/21/2018

## 2018-03-21 NOTE — Addendum Note (Signed)
Addended by: Nelva Nay on: 03/21/2018 12:42 PM   Modules accepted: Orders

## 2018-03-21 NOTE — Patient Instructions (Signed)
Take the antibiotic twice daily for a week.  Use the [facility] as needed for cough every 8 hours.  Follow-up if symptoms persist or worsen.  Follow-up in 1 year for annual exam.

## 2018-03-22 ENCOUNTER — Encounter: Payer: Self-pay | Admitting: Gynecology

## 2018-03-22 ENCOUNTER — Other Ambulatory Visit: Payer: Self-pay | Admitting: Gynecology

## 2018-03-22 DIAGNOSIS — R7989 Other specified abnormal findings of blood chemistry: Secondary | ICD-10-CM

## 2018-03-22 LAB — CBC WITH DIFFERENTIAL/PLATELET
Basophils Absolute: 61 cells/uL (ref 0–200)
Basophils Relative: 1.6 %
EOS ABS: 141 {cells}/uL (ref 15–500)
Eosinophils Relative: 3.7 %
HEMATOCRIT: 42.8 % (ref 35.0–45.0)
HEMOGLOBIN: 14.3 g/dL (ref 11.7–15.5)
LYMPHS ABS: 1429 {cells}/uL (ref 850–3900)
MCH: 29.7 pg (ref 27.0–33.0)
MCHC: 33.4 g/dL (ref 32.0–36.0)
MCV: 89 fL (ref 80.0–100.0)
MPV: 11.1 fL (ref 7.5–12.5)
Monocytes Relative: 8.6 %
NEUTROS ABS: 1843 {cells}/uL (ref 1500–7800)
Neutrophils Relative %: 48.5 %
Platelets: 262 10*3/uL (ref 140–400)
RBC: 4.81 10*6/uL (ref 3.80–5.10)
RDW: 11.8 % (ref 11.0–15.0)
Total Lymphocyte: 37.6 %
WBC mixed population: 327 cells/uL (ref 200–950)
WBC: 3.8 10*3/uL (ref 3.8–10.8)

## 2018-03-22 LAB — COMPREHENSIVE METABOLIC PANEL
AG Ratio: 1.8 (calc) (ref 1.0–2.5)
ALKALINE PHOSPHATASE (APISO): 58 U/L (ref 33–115)
ALT: 49 U/L — AB (ref 6–29)
AST: 35 U/L (ref 10–35)
Albumin: 4 g/dL (ref 3.6–5.1)
BILIRUBIN TOTAL: 0.3 mg/dL (ref 0.2–1.2)
BUN: 15 mg/dL (ref 7–25)
CALCIUM: 8.5 mg/dL — AB (ref 8.6–10.2)
CO2: 26 mmol/L (ref 20–32)
Chloride: 106 mmol/L (ref 98–110)
Creat: 1.08 mg/dL (ref 0.50–1.10)
Globulin: 2.2 g/dL (calc) (ref 1.9–3.7)
Glucose, Bld: 84 mg/dL (ref 65–99)
Potassium: 4.8 mmol/L (ref 3.5–5.3)
Sodium: 139 mmol/L (ref 135–146)
Total Protein: 6.2 g/dL (ref 6.1–8.1)

## 2018-03-22 LAB — LIPID PANEL
CHOLESTEROL: 151 mg/dL (ref <200)
HDL: 49 mg/dL — AB (ref >50)
LDL Cholesterol (Calc): 85 mg/dL (calc)
Non-HDL Cholesterol (Calc): 102 mg/dL (calc) (ref <130)
Total CHOL/HDL Ratio: 3.1 (calc) (ref <5.0)
Triglycerides: 83 mg/dL (ref <150)

## 2018-03-22 LAB — PAP IG W/ RFLX HPV ASCU

## 2018-03-22 LAB — TSH: TSH: 0.19 mIU/L — ABNORMAL LOW

## 2018-03-27 DIAGNOSIS — F4322 Adjustment disorder with anxiety: Secondary | ICD-10-CM | POA: Diagnosis not present

## 2018-04-20 DIAGNOSIS — F4322 Adjustment disorder with anxiety: Secondary | ICD-10-CM | POA: Diagnosis not present

## 2018-05-02 DIAGNOSIS — L810 Postinflammatory hyperpigmentation: Secondary | ICD-10-CM | POA: Diagnosis not present

## 2018-05-02 DIAGNOSIS — L700 Acne vulgaris: Secondary | ICD-10-CM | POA: Diagnosis not present

## 2018-05-04 DIAGNOSIS — F4322 Adjustment disorder with anxiety: Secondary | ICD-10-CM | POA: Diagnosis not present

## 2018-05-22 ENCOUNTER — Other Ambulatory Visit: Payer: Self-pay | Admitting: Gynecology

## 2018-05-22 DIAGNOSIS — R7989 Other specified abnormal findings of blood chemistry: Secondary | ICD-10-CM

## 2018-05-31 ENCOUNTER — Encounter: Payer: Self-pay | Admitting: Gynecology

## 2018-05-31 DIAGNOSIS — F419 Anxiety disorder, unspecified: Secondary | ICD-10-CM | POA: Diagnosis not present

## 2018-07-03 ENCOUNTER — Other Ambulatory Visit: Payer: BLUE CROSS/BLUE SHIELD

## 2018-07-03 DIAGNOSIS — R7989 Other specified abnormal findings of blood chemistry: Secondary | ICD-10-CM | POA: Diagnosis not present

## 2018-07-04 ENCOUNTER — Other Ambulatory Visit: Payer: Self-pay | Admitting: Gynecology

## 2018-07-04 DIAGNOSIS — R7989 Other specified abnormal findings of blood chemistry: Secondary | ICD-10-CM

## 2018-07-04 LAB — TSH: TSH: 0.13 mIU/L — ABNORMAL LOW

## 2018-07-11 ENCOUNTER — Other Ambulatory Visit: Payer: Self-pay | Admitting: Gynecology

## 2018-07-11 DIAGNOSIS — R7989 Other specified abnormal findings of blood chemistry: Secondary | ICD-10-CM

## 2018-07-11 MED ORDER — LEVOTHYROXINE SODIUM 100 MCG PO TABS: 100.0000 ug | ORAL_TABLET | Freq: Every day | ORAL | 1 refills | Status: DC

## 2018-07-13 ENCOUNTER — Other Ambulatory Visit: Payer: BLUE CROSS/BLUE SHIELD

## 2018-07-24 ENCOUNTER — Other Ambulatory Visit: Payer: Self-pay

## 2018-07-24 MED ORDER — LEVOTHYROXINE SODIUM 100 MCG PO TABS: 100.0000 ug | ORAL_TABLET | Freq: Every day | ORAL | 1 refills | Status: DC

## 2018-07-24 NOTE — Telephone Encounter (Signed)
Received fax from pharmacy stating patient looking for new Rx for generic Synthroid 100 mcg. I called and spoke with pharmacy staff and let them know I sent Rx 07/11/18 and it shows receipt confirmed by the pharmacy. He looked and said they do not have it. Therefore, I will resend the Rx.

## 2018-07-30 DIAGNOSIS — J0100 Acute maxillary sinusitis, unspecified: Secondary | ICD-10-CM | POA: Diagnosis not present

## 2018-08-07 DIAGNOSIS — L700 Acne vulgaris: Secondary | ICD-10-CM | POA: Diagnosis not present

## 2018-08-31 DIAGNOSIS — Z0130 Encounter for examination of blood pressure without abnormal findings: Secondary | ICD-10-CM | POA: Diagnosis not present

## 2018-08-31 DIAGNOSIS — Z131 Encounter for screening for diabetes mellitus: Secondary | ICD-10-CM | POA: Diagnosis not present

## 2018-08-31 DIAGNOSIS — Z713 Dietary counseling and surveillance: Secondary | ICD-10-CM | POA: Diagnosis not present

## 2018-08-31 DIAGNOSIS — Z136 Encounter for screening for cardiovascular disorders: Secondary | ICD-10-CM | POA: Diagnosis not present

## 2018-08-31 DIAGNOSIS — Z6820 Body mass index (BMI) 20.0-20.9, adult: Secondary | ICD-10-CM | POA: Diagnosis not present

## 2018-08-31 DIAGNOSIS — Z13220 Encounter for screening for lipoid disorders: Secondary | ICD-10-CM | POA: Diagnosis not present

## 2018-09-24 ENCOUNTER — Other Ambulatory Visit: Payer: Self-pay

## 2018-09-24 MED ORDER — LEVOTHYROXINE SODIUM 100 MCG PO TABS: 100.0000 ug | ORAL_TABLET | Freq: Every day | ORAL | 0 refills | Status: DC

## 2018-09-27 DIAGNOSIS — Z23 Encounter for immunization: Secondary | ICD-10-CM | POA: Diagnosis not present

## 2018-11-02 DIAGNOSIS — F419 Anxiety disorder, unspecified: Secondary | ICD-10-CM | POA: Diagnosis not present

## 2018-11-21 DIAGNOSIS — L738 Other specified follicular disorders: Secondary | ICD-10-CM | POA: Diagnosis not present

## 2018-12-05 ENCOUNTER — Other Ambulatory Visit: Payer: Self-pay | Admitting: Gynecology

## 2019-01-18 DIAGNOSIS — F419 Anxiety disorder, unspecified: Secondary | ICD-10-CM | POA: Diagnosis not present

## 2019-03-06 ENCOUNTER — Other Ambulatory Visit: Payer: Self-pay | Admitting: Gynecology

## 2019-04-25 ENCOUNTER — Other Ambulatory Visit: Payer: Self-pay

## 2019-04-29 ENCOUNTER — Encounter: Payer: Self-pay | Admitting: Gynecology

## 2019-04-29 ENCOUNTER — Other Ambulatory Visit: Payer: Self-pay

## 2019-04-29 ENCOUNTER — Ambulatory Visit (INDEPENDENT_AMBULATORY_CARE_PROVIDER_SITE_OTHER): Payer: BLUE CROSS/BLUE SHIELD | Admitting: Gynecology

## 2019-04-29 VITALS — BP 118/74 | Ht 64.0 in | Wt 126.0 lb

## 2019-04-29 DIAGNOSIS — Z13220 Encounter for screening for lipoid disorders: Secondary | ICD-10-CM

## 2019-04-29 DIAGNOSIS — R300 Dysuria: Secondary | ICD-10-CM | POA: Diagnosis not present

## 2019-04-29 DIAGNOSIS — Z79890 Hormone replacement therapy (postmenopausal): Secondary | ICD-10-CM | POA: Diagnosis not present

## 2019-04-29 DIAGNOSIS — Z01419 Encounter for gynecological examination (general) (routine) without abnormal findings: Secondary | ICD-10-CM

## 2019-04-29 DIAGNOSIS — N951 Menopausal and female climacteric states: Secondary | ICD-10-CM

## 2019-04-29 DIAGNOSIS — E039 Hypothyroidism, unspecified: Secondary | ICD-10-CM | POA: Diagnosis not present

## 2019-04-29 MED ORDER — ESTRADIOL 1 MG PO TABS: ORAL_TABLET | ORAL | 4 refills | Status: DC

## 2019-04-29 MED ORDER — LEVOTHYROXINE SODIUM 100 MCG PO TABS: ORAL_TABLET | ORAL | 4 refills | Status: DC

## 2019-04-29 MED ORDER — PROGESTERONE MICRONIZED 100 MG PO CAPS: 100.0000 mg | ORAL_CAPSULE | Freq: Every day | ORAL | 4 refills | Status: DC

## 2019-04-29 MED ORDER — CIPROFLOXACIN HCL 250 MG PO TABS: 250.0000 mg | ORAL_TABLET | Freq: Two times a day (BID) | ORAL | 0 refills | Status: DC

## 2019-04-29 MED ORDER — ALPRAZOLAM 0.5 MG PO TABS: 0.5000 mg | ORAL_TABLET | Freq: Two times a day (BID) | ORAL | 3 refills | Status: DC | PRN

## 2019-04-29 NOTE — Patient Instructions (Signed)
Try the higher dose of estrogen at bedtime.  Call if you have any issues with this.  Schedule your mammogram when you are able.  Follow-up in 1 year for annual exam.

## 2019-04-29 NOTE — Progress Notes (Signed)
    Alicia Myers 09-03-1971 [id number]        48 y.o.  G2P2 for annual gynecologic exam.  On HRT consisting of Estrace 1 mg and Prometrium 100 mg.  Taking this at bedtime.  Notes waking with hot flushes and sweats.  Doing well during the day.  No bleeding.  Also was recently treated through tele-med for UTI symptoms.  Most of her symptoms have resolved but she still is having end stream dysuria.  No frequency urgency low back pain fever or chills.  Past medical history,surgical history, problem list, medications, allergies, family history and social history were all reviewed and documented as reviewed in the EPIC chart.  ROS:  Performed with pertinent positives and negatives included in the history, assessment and plan.   Additional significant findings : None   Exam: Alicia Myers assistant Vitals:   04/29/19 0940  BP: 118/74  Weight: 126 lb (57.2 kg)  Height: 5\' 4"  (1.626 m)   Body mass index is 21.63 kg/m.  General appearance:  Normal affect, orientation and appearance. Skin: Grossly normal HEENT: Without gross lesions.  No cervical or supraclavicular adenopathy. Thyroid normal.  Lungs:  Clear without wheezing, rales or rhonchi Cardiac: RR, without RMG Abdominal:  Soft, nontender, without masses, guarding, rebound, organomegaly or hernia Breasts:  Examined lying and sitting without masses, retractions, discharge or axillary adenopathy.  Bilateral implants noted Pelvic:  Ext, BUS, Vagina: Normal  Cervix: Normal  Uterus: Anteverted, normal size, shape and contour, midline and mobile nontender   Adnexa: Without masses or tenderness    Anus and perineum: Normal   Rectovaginal: Normal sphincter tone without palpated masses or tenderness.    Assessment/Plan:  48 y.o. G2P2 female for annual gynecologic exam.  1. Dysuria persisting after recent treatment for UTI.  Urine analysis today is negative.  Discussed possible low-grade urethritis as well as other  possibilities to include interstitial cystitis.  Will cover with ciprofloxacin 250 mg twice daily x7 days.  Consider urology referral if symptoms continue.  Blackbox warning discussed. 2. HRT/menopausal symptoms.  Doing well except having issues at night with night sweats and waking.  Options reviewed and we decided to increase her bedtime Estrace to 1-1/2 tablets along with her 100 mg Prometrium.  We discussed the risks of HRT again to include thrombosis such as stroke heart attack DVT in the breast cancer issue.  She will call if this does not help with her night sweats.  Call if any bleeding. 3. Mammography 2018.  Patient to schedule mammogram now when she is able.  Always has very dense breasts and we have discussed MRIs in the past which she has declined.  I discussed a possible abbreviated MRI.  Should go ahead and get her mammogram now and then we can further discuss following this.  Breast exam normal today.  SBE monthly reviewed. 4. Pap smear 2019.  No Pap smear done today.  No history of abnormal Pap smears previously.  Plan repeat Pap smear at 3-year interval per current screening guidelines. 5. History of thyroid cancer status post thyroidectomy.  Check TSH today.  Refill Synthroid 100 mcg x1 year. 6. History of anxiety.  Uses Xanax sparingly.  Does well with this without side effects.  #60 with 3 refills 7. Health maintenance.  Baseline CBC, CMP and lipid profile ordered.  Follow-up 1 year, sooner as needed.   Alicia Auerbach MD, 10:29 AM 04/29/2019

## 2019-04-29 NOTE — Addendum Note (Signed)
Addended by: Nelva Nay on: 04/29/2019 11:33 AM   Modules accepted: Orders

## 2019-04-30 ENCOUNTER — Encounter: Payer: Self-pay | Admitting: Gynecology

## 2019-04-30 LAB — URINALYSIS, COMPLETE W/RFL CULTURE
Bacteria, UA: NONE SEEN /HPF
Bilirubin Urine: NEGATIVE
Glucose, UA: NEGATIVE
Hgb urine dipstick: NEGATIVE
Hyaline Cast: NONE SEEN /LPF
Ketones, ur: NEGATIVE
Leukocyte Esterase: NEGATIVE
Nitrites, Initial: NEGATIVE
Protein, ur: NEGATIVE
RBC / HPF: NONE SEEN /HPF (ref 0–2)
Specific Gravity, Urine: 1.01 (ref 1.001–1.03)
WBC, UA: NONE SEEN /HPF (ref 0–5)
pH: 7 (ref 5.0–8.0)

## 2019-04-30 LAB — COMPREHENSIVE METABOLIC PANEL
AG Ratio: 2.3 (calc) (ref 1.0–2.5)
ALT: 12 U/L (ref 6–29)
AST: 19 U/L (ref 10–35)
Albumin: 4.4 g/dL (ref 3.6–5.1)
Alkaline phosphatase (APISO): 48 U/L (ref 31–125)
BUN: 18 mg/dL (ref 7–25)
CO2: 28 mmol/L (ref 20–32)
Calcium: 9.2 mg/dL (ref 8.6–10.2)
Chloride: 104 mmol/L (ref 98–110)
Creat: 0.88 mg/dL (ref 0.50–1.10)
Globulin: 1.9 g/dL (calc) (ref 1.9–3.7)
Glucose, Bld: 78 mg/dL (ref 65–99)
Potassium: 4.3 mmol/L (ref 3.5–5.3)
Sodium: 139 mmol/L (ref 135–146)
Total Bilirubin: 0.3 mg/dL (ref 0.2–1.2)
Total Protein: 6.3 g/dL (ref 6.1–8.1)

## 2019-04-30 LAB — CBC WITH DIFFERENTIAL/PLATELET
Absolute Monocytes: 350 cells/uL (ref 200–950)
Basophils Absolute: 51 cells/uL (ref 0–200)
Basophils Relative: 1.1 %
Eosinophils Absolute: 152 cells/uL (ref 15–500)
Eosinophils Relative: 3.3 %
HCT: 41.6 % (ref 35.0–45.0)
Hemoglobin: 14.2 g/dL (ref 11.7–15.5)
Lymphs Abs: 1587 cells/uL (ref 850–3900)
MCH: 32.1 pg (ref 27.0–33.0)
MCHC: 34.1 g/dL (ref 32.0–36.0)
MCV: 93.9 fL (ref 80.0–100.0)
MPV: 10.8 fL (ref 7.5–12.5)
Monocytes Relative: 7.6 %
Neutro Abs: 2461 cells/uL (ref 1500–7800)
Neutrophils Relative %: 53.5 %
Platelets: 273 10*3/uL (ref 140–400)
RBC: 4.43 10*6/uL (ref 3.80–5.10)
RDW: 12.3 % (ref 11.0–15.0)
Total Lymphocyte: 34.5 %
WBC: 4.6 10*3/uL (ref 3.8–10.8)

## 2019-04-30 LAB — NO CULTURE INDICATED

## 2019-04-30 LAB — LIPID PANEL
Cholesterol: 171 mg/dL (ref <200)
HDL: 69 mg/dL (ref >=50)
LDL Cholesterol (Calc): 85 mg/dL (calc)
Non-HDL Cholesterol (Calc): 102 mg/dL (calc) (ref <130)
Total CHOL/HDL Ratio: 2.5 (calc) (ref <5.0)
Triglycerides: 80 mg/dL (ref <150)

## 2019-04-30 LAB — TSH: TSH: 3.61 mIU/L

## 2019-07-09 DIAGNOSIS — F419 Anxiety disorder, unspecified: Secondary | ICD-10-CM | POA: Diagnosis not present

## 2019-07-12 ENCOUNTER — Other Ambulatory Visit: Payer: Self-pay | Admitting: Gynecology

## 2019-07-24 ENCOUNTER — Other Ambulatory Visit: Payer: Self-pay | Admitting: Gynecology

## 2019-07-24 ENCOUNTER — Other Ambulatory Visit: Payer: Self-pay | Admitting: *Deleted

## 2019-07-24 MED ORDER — LEVOTHYROXINE SODIUM 100 MCG PO TABS: ORAL_TABLET | ORAL | 2 refills | Status: DC

## 2019-08-30 ENCOUNTER — Other Ambulatory Visit: Payer: Self-pay | Admitting: Gynecology

## 2019-08-30 DIAGNOSIS — Z1231 Encounter for screening mammogram for malignant neoplasm of breast: Secondary | ICD-10-CM

## 2019-09-03 ENCOUNTER — Encounter: Payer: Self-pay | Admitting: Gynecology

## 2019-09-03 ENCOUNTER — Ambulatory Visit
Admission: RE | Admit: 2019-09-03 | Discharge: 2019-09-03 | Disposition: A | Discharge status: Home / Self Care | Payer: BC Managed Care – PPO | Source: Ambulatory Visit | Attending: Gynecology | Admitting: Gynecology

## 2019-09-03 ENCOUNTER — Other Ambulatory Visit: Payer: Self-pay

## 2019-09-03 DIAGNOSIS — Z1231 Encounter for screening mammogram for malignant neoplasm of breast: Secondary | ICD-10-CM | POA: Diagnosis not present

## 2019-09-09 ENCOUNTER — Ambulatory Visit (INDEPENDENT_AMBULATORY_CARE_PROVIDER_SITE_OTHER): Payer: Self-pay | Admitting: Adult Health

## 2019-09-09 ENCOUNTER — Other Ambulatory Visit: Payer: Self-pay

## 2019-09-09 ENCOUNTER — Encounter: Payer: Self-pay | Admitting: Adult Health

## 2019-09-09 VITALS — BP 115/80 | HR 71 | Ht 64.0 in | Wt 120.0 lb

## 2019-09-09 DIAGNOSIS — F411 Generalized anxiety disorder: Secondary | ICD-10-CM

## 2019-09-09 DIAGNOSIS — C73 Malignant neoplasm of thyroid gland: Secondary | ICD-10-CM | POA: Insufficient documentation

## 2019-09-09 NOTE — Progress Notes (Signed)
[facility] MD/PA/NP Initial Note  09/09/2019 5:37 PM Alicia Myers  MRN:  [id number]  Chief Complaint:  Chief Complaint    Depression; Anxiety; Insomnia; Other      HPI:   Describes mood today as "so-so". Pleasant. Mood symptoms - reports depression, anxiety, and irritability.  Needs to let things go - holds on to things. Stating "I'm a pleaser". Feels like things have to be perfect. Waiting on appreciation from her husband and doesn't get it. Feels like she cleans 60% of the time. Lives in an agitated state. Has had 2 outbursts lately. Taking things out on husband. Husband says she's out of control. She feels like he has no "empathy". Feels like she constantly has to do for others and has no time to herself. Gets aggravated - then exploeds. Has cycles that go "round and round". Saying things she shouldn't and then regrets it. Does not feel like husband is supportive about his children keeping things clean. Plans to leave on the weekends when children come to stay. Feels like she has had a lot of things going on over past several months.Making list and spread sheets - "so I can check things off". Had 2 housekeepers before Covid-19. Stating "I over think everything I do". Feels like mind is always spinning. Recently renovated a house for past 12 weeks. 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 Has 4 teenage children - 2 of hers and 2 of her husbands. One child at [employer] (d)and one at [employer] (s). Has a senior (d) and a 9th grader (s)at home. Sister moving in with her from [place] - married for 8 years and found out husband is gay. Stable interest and motivation. Taking medications as prescribed.  Energy levels stable. Active, has a regular exercise routine. Runner. Works full-time and part-time. Enjoys some usual interests and activities. Spending time with family - husband and sons. Feels like she has a "really good life". Stating "on paper I'm  blessed". Appetite increased - "stress eater" - "ice cream". Weight gain - 5 pounds. Sleeps better some nights than others. Gets to sleep easily, then wakes up and can't get back to sleep. Averages 4 to 5 hours. Focus and concentration stable. Completing tasks. Managing aspects of household.  Denies SI or HI. Denies AH or VH.  Visit Diagnosis:  No diagnosis found.  Past Psychiatric History: Denies psychiatric hospitalizations.  Past Medical History:  Past Medical History:  Diagnosis Date  . Biceps tendon tear   . Thyroid cancer ([place])    PAPILLARY THYROID CA --THYROIDECTOMY    Past Surgical History:  Procedure Laterality Date  . AUGMENTATION MAMMAPLASTY  2007  . THYROIDECTOMY     PAPILLARY THYROID CANCER  . TONSILLECTOMY      Family Psychiatric History: Denies  Family History:  Family History  Problem Relation Age of Onset  . Heart disease Paternal Grandfather 12       heart attack  . Diabetes Father   . Breast cancer Paternal Aunt 47    Social History:  Social History   Socioeconomic History  . Marital status: Married    Spouse name: Not on file  . Number of children: Not on file  . Years of education: Not on file  . Highest education level: Not on file  Occupational History  . Not on file  Social Needs  . Financial resource strain: Not on file  . Food insecurity    Worry: Not on  file    Inability: Not on file  . Transportation needs    Medical: Not on file    Non-medical: Not on file  Tobacco Use  . Smoking status: Never Smoker  . Smokeless tobacco: Never Used  Substance and Sexual Activity  . Alcohol use: Yes    Alcohol/week: 2.0 standard drinks    Types: 2 Standard drinks or equivalent per week  . Drug use: No  . Sexual activity: Yes    Birth control/protection: Post-menopausal    Comment: 1st intercourse 48 yo-Fewer than 5 partners  Lifestyle  . Physical activity    Days per week: Not on file    Minutes per session: Not on file  . Stress: Not  on file  Relationships  . Social Herbalist on phone: Not on file    Gets together: Not on file    Attends religious service: Not on file    Active member of club or organization: Not on file    Attends meetings of clubs or organizations: Not on file    Relationship status: Not on file  Other Topics Concern  . Not on file  Social History Narrative  . Not on file    Allergies:  Allergies  Allergen Reactions  . Sulfa Antibiotics Hives and Other (See Comments)    Passed out    Metabolic Disorder Labs: No results found for: HGBA1C, MPG Lab Results  Component Value Date   PROLACTIN 6.1 11/27/2014   Lab Results  Component Value Date   CHOL 171 04/29/2019   TRIG 80 04/29/2019   HDL 69 04/29/2019   CHOLHDL 2.5 04/29/2019   VLDL 11 12/17/2015   LDLCALC 85 04/29/2019   LDLCALC 85 03/21/2018   Lab Results  Component Value Date   TSH 3.61 04/29/2019   TSH 0.13 (L) 07/03/2018    Therapeutic Level Labs: No results found for: LITHIUM No results found for: VALPROATE No components found for:  CBMZ  Current Medications: Current Outpatient Medications  Medication Sig Dispense Refill  . ALPRAZolam (XANAX) 0.5 MG tablet Take 1 tablet (0.5 mg total) by mouth 2 (two) times daily as needed. 60 tablet 3  . ALPRAZolam (XANAX) 0.5 MG tablet Take by mouth.    . ciprofloxacin (CIPRO) 250 MG tablet Take 1 tablet (250 mg total) by mouth 2 (two) times daily. For 7 days (Patient not taking: Reported on 09/09/2019) 14 tablet 0  . estradiol (ESTRACE) 1 MG tablet Use 1 1/2 tablets daily 120 tablet 4  . levothyroxine (SYNTHROID) 112 MCG tablet     . progesterone (PROMETRIUM) 100 MG capsule Take 1 capsule (100 mg total) by mouth at bedtime. 90 capsule 4   No current facility-administered medications for this visit.     Medication Side Effects: none  Orders placed this visit:  No orders of the defined types were placed in this encounter.   Psychiatric Specialty Exam:  ROS   Blood pressure 115/80, pulse 71, height 5\' 4"  (1.626 m), weight 120 lb (54.4 kg), last menstrual period 09/18/2016.Body mass index is 20.6 kg/m.  General Appearance: Neat and Well Groomed  Eye Contact:  Good  Speech:  Normal Rate  Volume:  Normal  Mood:  Euthymic  Affect:  Congruent  Thought Process:  Goal Directed  Orientation:  Full (Time, Place, and Person)  Thought Content: Logical   Suicidal Thoughts:  No  Homicidal Thoughts:  No  Memory:  WNL  Judgement:  Good  Insight:  Good  Psychomotor Activity:  Normal  Concentration:  Concentration: Good  Recall:  Good  Fund of Knowledge: Good  Language: Good  Assets:  Communication Skills Desire for Improvement Financial Resources/Insurance Housing Intimacy Leisure Time Physical Health Resilience Social Support Talents/Skills Transportation Vocational/Educational  ADL's:  Intact  Cognition: WNL  Prognosis:  Good   Screenings: None  Receiving Psychotherapy: No   Treatment Plan/Recommendations:  Plan:  1. Refer to therapy - Lina Sayre  RTC as needed.   Patient advised to contact office with any questions, adverse effects, or acute worsening in signs and symptoms.    Aloha Gell, NP

## 2019-09-11 ENCOUNTER — Encounter: Payer: Self-pay | Admitting: Gynecology

## 2019-09-27 ENCOUNTER — Encounter: Payer: Self-pay | Admitting: Psychiatry

## 2019-09-27 ENCOUNTER — Other Ambulatory Visit: Payer: Self-pay

## 2019-09-27 ENCOUNTER — Ambulatory Visit (INDEPENDENT_AMBULATORY_CARE_PROVIDER_SITE_OTHER): Payer: BLUE CROSS/BLUE SHIELD | Admitting: Psychiatry

## 2019-09-27 DIAGNOSIS — F411 Generalized anxiety disorder: Secondary | ICD-10-CM

## 2019-09-27 NOTE — Progress Notes (Signed)
[facility] Counselor Initial Adult Exam  Name: Alicia Myers Date: 09/27/2019 MRN: [id number] DOB: 1971-02-01 PCP: Eulas Post, MD  Time spent: 54 minutes   Guardian/Payee:  Patient    Paperwork requested:  Yes   Reason for Visit /Presenting Problem: Patient reported that she and husband have been going to marital therapy due to having a blended family.  She stated the counselor has cancer and she is not sure he will be there much longer.  Patient shared she is not sure what is going on with her.  She explained that she is turning 48 and is having lots of emotions.  Patient explained that she and husband have been together 12 years they have 4 children and they get along well.  Husband and I realize we are each protective on our children.  His oldest daughter is manipulative and controlling and you can't say anything negative about her.  Her son is 78 and is not motivated in physical activity and that drives husband crazy and he tells her everything about it.  That creates stress and puts strain on patient.  Now her sister is living with them due to going through a divorce because husband recently told her he is gay.  Her sister's daughter is in college.  Moved into a new house that is everyone's but had to do 12 weeks of renovations on it.  She shared she used to exercise but no energy to do that currently.  Really angry with husband over something small.  I need to figure out how to be me and say things to my husband in a way that he can hear. Patient works full time and does his pay roll. Childhood issues with dad.  1st husband was a functioning alcoholic and she had to deal with that and he was not in their life since 2004 due to him drinking and driving with kids. Her kids didn't have a huge female figure.  Patient reported that there are lots of issues that she realizes impact her and the relationship.  Patient was encouraged to think through what she would like to set up for goals to  be discussed at next session.  She was encouraged to research E MDR as a possible treatment option to be discussed further at next session.  Mental Status Exam:   Appearance:   Well Groomed     Behavior:  Sharing  Motor:  Normal  Speech/Language:   Normal Rate  Affect:  Appropriate  Mood:  anxious  Thought process:  normal  Thought content:    WNL  Sensory/Perceptual disturbances:    WNL  Orientation:  oriented to person, place, time/date and situation  Attention:  Good  Concentration:  Good  Memory:  WNL  Fund of knowledge:   Good  Insight:    Good  Judgment:   Good  Impulse Control:  Good   Reported Symptoms:  Anxiety, sadness, crying spells, melt downs, anger, triggered responses  Risk Assessment: Danger to Self:  No Self-injurious Behavior: No Danger to Others: No Duty to Warn:no Physical Aggression / Violence:No  Access to Firearms a concern: No  Gang Involvement:No  Patient / guardian was educated about steps to take if suicide or homicide risk level increases between visits: yes While future psychiatric events cannot be accurately predicted, the patient does not currently require acute inpatient psychiatric care and does not currently meet [facility] involuntary commitment criteria.  Substance Abuse History: Current substance abuse: No  Past Psychiatric History:   Previous psychological history is significant for family issues Outpatient Providers:family therapist History of Psych Hospitalization: No  Psychological Testing: none   Abuse History: Victim of No., none   Report needed: No. Victim of Neglect:No. Perpetrator of none  Witness / Exposure to Domestic Violence: No   Protective Services Involvement: No  Witness to [employer] Violence:  No   Family History:  Family History  Problem Relation Age of Onset  . Heart disease Paternal Grandfather 91       heart attack  . Diabetes Father   . Breast cancer Paternal Aunt 55    Living situation:  the patient lives with their family  Sexual Orientation:  Straight  Relationship Status: married  Name of spouse / other:Matt             If a parent, number of children / ages:2 children and 2 stepchildren  Support Systems; sister, parents  Museum/gallery curator Stress:  No   Income/Employment/Disability: Employment  Armed forces logistics/support/administrative officer: No   Educational History: Education: Scientist, product/process development:   [employer]  Any cultural differences that may affect / interfere with treatment:  not applicable   Recreation/Hobbies: Chief of Staff,   Stressors:Marital or family conflict  Strengths:  Supportive Relationships, Spirituality and Hopefulness  Barriers:  family Air traffic controller History: Pending legal issue / charges: The patient has no significant history of legal issues. History of legal issue / charges: none  Medical History/Surgical History:reviewed Past Medical History:  Diagnosis Date  . Biceps tendon tear   . Thyroid cancer ([place])    PAPILLARY THYROID CA --THYROIDECTOMY    Past Surgical History:  Procedure Laterality Date  . AUGMENTATION MAMMAPLASTY  2007  . THYROIDECTOMY     PAPILLARY THYROID CANCER  . TONSILLECTOMY      Medications: Current Outpatient Medications  Medication Sig Dispense Refill  . ALPRAZolam (XANAX) 0.5 MG tablet Take 1 tablet (0.5 mg total) by mouth 2 (two) times daily as needed. 60 tablet 3  . ALPRAZolam (XANAX) 0.5 MG tablet Take by mouth.    . ciprofloxacin (CIPRO) 250 MG tablet Take 1 tablet (250 mg total) by mouth 2 (two) times daily. For 7 days (Patient not taking: Reported on 09/09/2019) 14 tablet 0  . estradiol (ESTRACE) 1 MG tablet Use 1 1/2 tablets daily 120 tablet 4  . levothyroxine (SYNTHROID) 112 MCG tablet     . progesterone (PROMETRIUM) 100 MG capsule Take 1 capsule (100 mg total) by mouth at bedtime. 90 capsule 4   No current facility-administered medications for this visit.     Allergies  Allergen  Reactions  . Sulfa Antibiotics Hives and Other (See Comments)    Passed out    Diagnoses:    ICD-10-CM   1. Generalized anxiety disorder  F41.1     Plan of Care: Patient is to think through what she would like to set for goals for treatment to be discussed and treatment plan developed at next session.  Patient is also to research E MDR as a treatment option to be discussed further at next session.   Lina Sayre, [facility]

## 2019-09-29 ENCOUNTER — Encounter: Payer: Self-pay | Admitting: Psychiatry

## 2019-10-11 ENCOUNTER — Encounter

## 2019-10-22 DIAGNOSIS — Z85850 Personal history of malignant neoplasm of thyroid: Secondary | ICD-10-CM | POA: Diagnosis not present

## 2019-10-22 DIAGNOSIS — N951 Menopausal and female climacteric states: Secondary | ICD-10-CM | POA: Diagnosis not present

## 2019-10-25 ENCOUNTER — Ambulatory Visit (INDEPENDENT_AMBULATORY_CARE_PROVIDER_SITE_OTHER): Payer: BC Managed Care – PPO | Admitting: Psychiatry

## 2019-10-25 ENCOUNTER — Other Ambulatory Visit: Payer: Self-pay

## 2019-10-25 DIAGNOSIS — F411 Generalized anxiety disorder: Secondary | ICD-10-CM | POA: Diagnosis not present

## 2019-10-25 NOTE — Progress Notes (Signed)
      [facility] Counselor/Therapist Progress Note  Patient ID: Alicia Myers, MRN: [id number],    Date: 10/25/2019  Time Spent: 51 minutes start time 10:09 AM end time 11 AM  Treatment Type: Individual Therapy  Reported Symptoms: anxiety, sleep issues, overwhelmed, focusing issues  Mental Status Exam:  Appearance:   Well Groomed     Behavior:  Sharing  Motor:  Normal  Speech/Language:   Normal Rate  Affect:  Appropriate  Mood:  normal  Thought process:  normal  Thought content:    WNL  Sensory/Perceptual disturbances:    WNL  Orientation:  oriented to person, place, time/date and situation  Attention:  Good  Concentration:  Good  Memory:  WNL  Fund of knowledge:   Good  Insight:    Good  Judgment:   Good  Impulse Control:  Good   Risk Assessment: Danger to Self:  No Self-injurious Behavior: No Danger to Others: No Duty to Warn:no Physical Aggression / Violence:No  Access to Firearms a concern: No  Gang Involvement:No   Subjective: Patient was present for session.  Patient reported she has been doing better with her anxiety since last session.  She explained she has been working hard on her self talk especially when her stepdaughter is coming into town.  Patient is also realizing she cannot take things personal.  Taught patient the box theory in session to help give her a visual of how to recognize what is in her power and control and what is not.  The importance of not dealing with the things that she cannot control fix or change were discussed with patient.  Patient was able to think through different situations where she could utilize that CBT skill to help her.  Develop treatment plan with patient in session she could not sign it due to the COVID-19.  Patient was also taught ST OPP CBT skill to help her when her thoughts start cycling.  Patient agreed to work on setting up some limits with others and trying to focus on doing what she can to take care of herself  and manage emotions appropriately.  Interventions: Cognitive Behavioral Therapy and Solution-Oriented/Positive Psychology  Diagnosis:   ICD-10-CM   1. Generalized anxiety disorder  F41.1     Plan: Patient is to use CBT and coping skills including box theory and ST OPP CBT thought stopping technique.  Patient is also to continue exercising and finding ways to add structure to her life. Long-term goal: Resolve the core conflict that is the source of anxiety Short-term goal: Benefiel the major life complex from the past and present the form the basis for present anxiety Increase understanding of beliefs and messages that produce worry and anxiety  Lina Sayre, [facility]

## 2019-10-28 DIAGNOSIS — R4586 Emotional lability: Secondary | ICD-10-CM | POA: Diagnosis not present

## 2019-10-28 DIAGNOSIS — G479 Sleep disorder, unspecified: Secondary | ICD-10-CM | POA: Diagnosis not present

## 2019-10-28 DIAGNOSIS — J209 Acute bronchitis, unspecified: Secondary | ICD-10-CM | POA: Diagnosis not present

## 2019-10-28 DIAGNOSIS — R232 Flushing: Secondary | ICD-10-CM | POA: Diagnosis not present

## 2019-10-28 DIAGNOSIS — N951 Menopausal and female climacteric states: Secondary | ICD-10-CM | POA: Diagnosis not present

## 2019-11-08 ENCOUNTER — Ambulatory Visit: Payer: BLUE CROSS/BLUE SHIELD | Admitting: Psychiatry

## 2019-11-13 DIAGNOSIS — Z20828 Contact with and (suspected) exposure to other viral communicable diseases: Secondary | ICD-10-CM | POA: Diagnosis not present

## 2019-11-22 ENCOUNTER — Encounter

## 2019-11-22 ENCOUNTER — Other Ambulatory Visit: Payer: Self-pay

## 2019-11-22 ENCOUNTER — Ambulatory Visit (INDEPENDENT_AMBULATORY_CARE_PROVIDER_SITE_OTHER): Payer: BC Managed Care – PPO | Admitting: Psychiatry

## 2019-11-22 DIAGNOSIS — F411 Generalized anxiety disorder: Secondary | ICD-10-CM

## 2019-11-22 NOTE — Progress Notes (Signed)
      [facility] Counselor/Therapist Progress Note  Patient ID: Alicia Myers, MRN: [id number],    Date: 11/22/2019  Time Spent: 51 minutes start time 10:05 AM end time 10:56 AM  Treatment Type: Individual Therapy  Reported Symptoms: anxiety, frustration, sadness  Mental Status Exam:  Appearance:   Well Groomed     Behavior:  Sharing  Motor:  Normal  Speech/Language:   Normal Rate  Affect:  Appropriate  Mood:  normal  Thought process:  normal  Thought content:    WNL  Sensory/Perceptual disturbances:    WNL  Orientation:  oriented to person, place, time/date and situation  Attention:  Good  Concentration:  Good  Memory:  WNL  Fund of knowledge:   Good  Insight:    Good  Judgment:   Good  Impulse Control:  Good   Risk Assessment: Danger to Self:  No Self-injurious Behavior: No Danger to Others: No Duty to Warn:no Physical Aggression / Violence:No  Access to Firearms a concern: No  Gang Involvement:No   Subjective: Patient was present for session. She shared that the issues with the step-daughter continue and she is back from college until the middle of January.  She explained that it is already stressing her and it has only been 3 days.  She shared that she is continually feels that she is in competition for his attention which she knows is not appropriate and wants to figure out how to let go of it and not be so triggered by her stepdaughter's behaviors.  Did EMDR set on her sitting on his lap, suds level 10, negative cognition "he is never going to love me like that", felt hurt and sadness all over.  Patient was able to reduce suds level to 7.  Through the exercise she was able to recognize that they both are stuck in what happened in the past when he was not allowed to see his children for 2 years.  Patient was able to identify that is their issue and she has to continue focusing on taking care of herself rather than trying to be a part of the dynamic.  The  importance of her letting her husband know that she is going to focus on spending time other places and working on self-care so that he can have time to work on the relationship with his daughter if that is what needs to happen.  The importance of her removing herself so that she does not get triggered was discussed with patient.    Interventions: Cognitive Behavioral Therapy, Solution-Oriented/Positive Psychology and Eye Movement Desensitization and Reprocessing (EMDR)  Diagnosis:   ICD-10-CM   1. Generalized anxiety disorder  F41.1     Plan: Patient is to utilize CBT and coping skills to help decrease her anxiety levels.  Patient is to focus on her self-care especially while her stepdaughter is in town so that her anxiety is not increased but she is able to decrease it.  Patient is to engage in positive activities with her children and sister to help keep herself grounded. Long-term goal: Resolve the core conflict that is the source of anxiety Short-term goal: Identified the major life complex from the past and present that form the basis for the anxiety Increase understanding of beliefs and messages that produce the worry and anxiety  Lina Sayre, [facility]

## 2019-11-25 DIAGNOSIS — R232 Flushing: Secondary | ICD-10-CM | POA: Diagnosis not present

## 2019-11-25 DIAGNOSIS — N951 Menopausal and female climacteric states: Secondary | ICD-10-CM | POA: Diagnosis not present

## 2019-11-25 DIAGNOSIS — R6882 Decreased libido: Secondary | ICD-10-CM | POA: Diagnosis not present

## 2019-11-25 DIAGNOSIS — G479 Sleep disorder, unspecified: Secondary | ICD-10-CM | POA: Diagnosis not present

## 2019-11-28 DIAGNOSIS — R6882 Decreased libido: Secondary | ICD-10-CM | POA: Diagnosis not present

## 2019-11-28 DIAGNOSIS — G479 Sleep disorder, unspecified: Secondary | ICD-10-CM | POA: Diagnosis not present

## 2019-11-28 DIAGNOSIS — N951 Menopausal and female climacteric states: Secondary | ICD-10-CM | POA: Diagnosis not present

## 2019-12-06 ENCOUNTER — Ambulatory Visit (INDEPENDENT_AMBULATORY_CARE_PROVIDER_SITE_OTHER): Payer: BC Managed Care – PPO | Admitting: Psychiatry

## 2019-12-06 ENCOUNTER — Encounter: Payer: Self-pay | Admitting: Psychiatry

## 2019-12-06 ENCOUNTER — Other Ambulatory Visit: Payer: Self-pay

## 2019-12-06 DIAGNOSIS — F411 Generalized anxiety disorder: Secondary | ICD-10-CM

## 2019-12-06 NOTE — Progress Notes (Signed)
      [facility] Counselor/Therapist Progress Note  Patient ID: Alicia Myers, MRN: [id number],    Date: 12/06/2019  Time Spent: 51 minutes start time 10:06 AM end time 10:57 AM  Treatment Type: Individual Therapy  Reported Symptoms: anxiety, frustration  Mental Status Exam:  Appearance:   Well Groomed     Behavior:  Sharing  Motor:  Normal  Speech/Language:   Normal Rate  Affect:  Appropriate  Mood:  normal  Thought process:  normal  Thought content:    WNL  Sensory/Perceptual disturbances:    WNL  Orientation:  oriented to person, place, time/date and situation  Attention:  Good  Concentration:  Good  Memory:  WNL  Fund of knowledge:   Good  Insight:    Good  Judgment:   Good  Impulse Control:  Good   Risk Assessment: Danger to Self:  No Self-injurious Behavior: No Danger to Others: No Duty to Warn:no Physical Aggression / Violence:No  Access to Firearms a concern: No  Gang Involvement:No   Subjective: Patient was present for session.  She shared that she did well after session practicing her skills.  She stated she has been able to detach herself much better.  Patient went on to share that even though she is following through on plans and utilizing her tools that is very difficult still to be in the situation.  Patient was able to discuss different things that continue to go on between her husband and her stepdaughter and the stress that it creates within her own situation.  Discussed different ways that she can manage her emotions appropriately and be able to address the issues in an appropriate manner.  Patient also discussed the fact that she is hoping to go to her family's for Christmas which is her typical time to have a few days to relax.  She shared that she is fearful her stepdaughter is going to want to come with them and that we will make the dynamic very difficult.  Discussed different ways that she could manage that and a plan was developed in session  to help her encourage her husband to take his daughter hunting after Christmas rather than going with her to her families.  Patient was reminded that she says to stay grounded and focus on the things that she can control fix and change and continue to let go of the other things.  Interventions: Cognitive Behavioral Therapy and Solution-Oriented/Positive Psychology  Diagnosis:   ICD-10-CM   1. Generalized anxiety disorder  F41.1     Plan: Patient is to utilize CBT and coping skills to help decrease anxiety.  Patient is also to follow plans from session to help her continue to address things with her husband in an appropriate manner that allows her to be able to take care of herself. Long-term goal: Resolve the core conflict that is the source of anxiety Short-term goal: Identify the major conflicts from the past and present that form the basis for present anxiety Increase understanding of beliefs and messages that produce the worry and anxiety  Lina Sayre, [facility]

## 2019-12-26 DIAGNOSIS — Z20822 Contact with and (suspected) exposure to covid-19: Secondary | ICD-10-CM | POA: Diagnosis not present

## 2019-12-30 ENCOUNTER — Ambulatory Visit (INDEPENDENT_AMBULATORY_CARE_PROVIDER_SITE_OTHER): Payer: BC Managed Care – PPO | Admitting: Psychiatry

## 2019-12-30 ENCOUNTER — Other Ambulatory Visit: Payer: Self-pay

## 2019-12-30 DIAGNOSIS — F411 Generalized anxiety disorder: Secondary | ICD-10-CM

## 2019-12-30 NOTE — Progress Notes (Signed)
[facility] Counselor/Therapist Progress Note  Patient ID: Alicia Myers, MRN: [id number],    Date: 12/30/2019  Time Spent: 50 minutes start time 10:08 AM end time 10:58 AM  Treatment Type: Individual Therapy  Reported Symptoms: anxiety, anger, frustration, sleep issues  Mental Status Exam:  Appearance:   Well Groomed     Behavior:  Sharing  Motor:  Normal  Speech/Language:   Normal Rate  Affect:  Appropriate  Mood:  anxious  Thought process:  normal  Thought content:    Rumination  Sensory/Perceptual disturbances:    WNL  Orientation:  oriented to person, place, time/date and situation  Attention:  Good  Concentration:  Good  Memory:  WNL  Fund of knowledge:   Good  Insight:    Good  Judgment:   Good  Impulse Control:  Good   Risk Assessment: Danger to Self:  No Self-injurious Behavior: No Danger to Others: No Duty to Warn:no Physical Aggression / Violence:No  Access to Firearms a concern: No  Gang Involvement:No   Subjective: Patient was present for session.  She shared it has been a hard time the past few weeks.  She stated that she knows some of the issues are her and some are husband's.  She stated she just isn't sure what to do.  Allowed time for patient to share what has been going on over the past couple weeks.  Patient explained that she is tried not to engage her husband concerning things regarding his daughter since that seems to create fights for them.  Patient gave examples of the things that she does that push the patient's buttons.  She was encouraged to recognize that she is right her stepdaughter's behavior is not appropriate but if her husband is not willing to address it or sees that that way it is pointless for her to address.  She shared that she was able to go on her family vacation without the daughter and husband and that was very positive.  She also went away for a weekend with friends and had people to talk to about some of her  frustrations and that was also helpful.  Unfortunately when she returned her husband made it clear that he was not responsible for her happiness and that she needed to figure out how to be happy on her own.  Patient was encouraged to recognize what he said and to focus on doing that even though that is not the type of marriage that she is hoping for.  Patient was encouraged to recognize she cannot control fix or change his behavior, so she has to focus on taking care of herself and releasing emotions in an appropriate manner so that outcome out in inappropriate ways.  Discussed different ways that she can release her emotions appropriately and the importance of talking herself through just spending time with her kids until her stepdaughter goes back to college in a few weeks.  At that time she can assess more on how things will be in the marriage.  She shared that she is very sad because her son has made a clear that he cannot take the way her husband treats her any longer so he will be going away to school and not returning in the summer.  She went on to explain she knows that is the best thing because he is getting get to the point where he says something and that can escalate situation.  Patient was again reminded she needs to  focus on the things she can control fix and change it at this point that is her own choices.  She agreed to work on spending more time out of the house with friends and work until things get better and her 60 goes back to college.  Interventions: Cognitive Behavioral Therapy and Solution-Oriented/Positive Psychology  Diagnosis:   ICD-10-CM   1. Generalized anxiety disorder  F41.1     Plan: Patient is to use CBT and coping skills to help decrease anxiety symptoms.  Patient is to take time to take care of herself by spending time with friends and family that bring her joy.  Patient is also to continue exercising to release as much emotion in that manner as well. Long-term  goal: Resolve the core conflict that is the source of anxiety Short-term goal: Identify the major life complex from the past and present in the form the basis for present anxiety Increase understanding of beliefs and messages that produce some worry and anxiety  Lina Sayre, [facility]

## 2020-01-13 ENCOUNTER — Encounter: Payer: Self-pay | Admitting: Psychiatry

## 2020-01-13 ENCOUNTER — Other Ambulatory Visit: Payer: Self-pay

## 2020-01-13 ENCOUNTER — Ambulatory Visit (INDEPENDENT_AMBULATORY_CARE_PROVIDER_SITE_OTHER): Payer: BC Managed Care – PPO | Admitting: Psychiatry

## 2020-01-13 DIAGNOSIS — F411 Generalized anxiety disorder: Secondary | ICD-10-CM

## 2020-01-13 NOTE — Progress Notes (Signed)
      [facility] Counselor/Therapist Progress Note  Patient ID: Alicia Myers, MRN: [id number],    Date: 01/13/2020  Time Spent: 50 minutes start time 10:11 AM end time 11:01 AM  Treatment Type: Individual Therapy  Reported Symptoms: anxiety, anger, hurt  Mental Status Exam:  Appearance:   Well Groomed     Behavior:  Sharing  Motor:  Normal  Speech/Language:   Normal Rate  Affect:  Appropriate  Mood:  anxious  Thought process:  normal  Thought content:    WNL  Sensory/Perceptual disturbances:    WNL  Orientation:  oriented to person, place, time/date and situation  Attention:  Good  Concentration:  Good  Memory:  WNL  Fund of knowledge:   Good  Insight:    Good  Judgment:   Good  Impulse Control:  Good   Risk Assessment: Danger to Self:  No Self-injurious Behavior: No Danger to Others: No Duty to Warn:no Physical Aggression / Violence:No  Access to Firearms a concern: No  Gang Involvement:No   Subjective: Patient was present for session.  She reported that things are still difficult with her husband.   She explained that there are just some things they don't agree on currently.  Patient explained that most things.  Relate back to his daughter.  Patient did EMDR set on the dating on his phone, suds level 10, negative cognition "I do not matter" felt anger and hurt all over with BP increasing.  Patient was able to reduce level of disturbance to 4.  She was able to realize she has to keep perspective on the situation and remind herself that his daughter functions not at her age is a 51 year old but has a 76-year-old little girl.  She also has to remind herself that her husband still sees her at that age so trying to reason with that is only can leave her frustrated and agitated.  The importance of her trying to remember that information to help her keep perspective when interacting with all of them is going to be very important.  Patient is also to continue doing her  running and self-care to help release any emotions that surface in a positive manner  Interventions: Solution-Oriented/Positive Psychology and Eye Movement Desensitization and Reprocessing (EMDR)  Diagnosis:   ICD-10-CM   1. Generalized anxiety disorder  F41.1     Plan: Patient is to continue using CBT and coping skills to decrease anxiety symptoms.  Patient is to work on her perspective when interacting with her husband and stepdaughter and remind herself that they both see her still as a 16-year-old not her age.  Is to continue exercising to release any of the irrational emotions from her body Long-term goal: Resolve the core conflict that is the source of anxiety Short-term goal: Identify the major life complex from the past and present the form the basis for present anxiety Increase understanding of beliefs and messages that produce the worry and anxiety  Lina Sayre, [facility]

## 2020-01-27 ENCOUNTER — Encounter: Payer: Self-pay | Admitting: Psychiatry

## 2020-01-27 ENCOUNTER — Ambulatory Visit (INDEPENDENT_AMBULATORY_CARE_PROVIDER_SITE_OTHER): Payer: BC Managed Care – PPO | Admitting: Psychiatry

## 2020-01-27 ENCOUNTER — Other Ambulatory Visit: Payer: Self-pay

## 2020-01-27 DIAGNOSIS — R6882 Decreased libido: Secondary | ICD-10-CM | POA: Diagnosis not present

## 2020-01-27 DIAGNOSIS — N951 Menopausal and female climacteric states: Secondary | ICD-10-CM | POA: Diagnosis not present

## 2020-01-27 DIAGNOSIS — F411 Generalized anxiety disorder: Secondary | ICD-10-CM

## 2020-01-27 DIAGNOSIS — G479 Sleep disorder, unspecified: Secondary | ICD-10-CM | POA: Diagnosis not present

## 2020-01-27 NOTE — Progress Notes (Signed)
[facility] Counselor/Therapist Progress Note  Patient ID: Alicia Myers, MRN: [id number],    Date: 01/27/2020  Time Spent: 51 minutes start time 10:08 AM end time 10:59 AM  Treatment Type: Individual Therapy  Reported Symptoms: anxiety, frustration, hurt, sadness  Mental Status Exam:  Appearance:   Well Groomed     Behavior:  Sharing  Motor:  Normal  Speech/Language:   Normal Rate  Affect:  Appropriate  Mood:  irritable  Thought process:  normal  Thought content:    WNL  Sensory/Perceptual disturbances:    WNL  Orientation:  oriented to person, place, time/date and situation  Attention:  Good  Concentration:  Good  Memory:  WNL  Fund of knowledge:   Good  Insight:    Good  Judgment:   Good  Impulse Control:  Good   Risk Assessment: Danger to Self:  No Self-injurious Behavior: No Danger to Others: No Duty to Warn:no Physical Aggression / Violence:No  Access to Firearms a concern: No  Gang Involvement:No   Subjective: Patient was present for session.  She reported that it has been a hard morning.  She went on to share that there was another issue with her husband.  She explained that he became very upset over his daughter coming into town and not spending time with him.  Which led to a discussion with them regarding his relationship with his daughter and need to recognize that she is 20 and they need to let her grow up.  Patient explained it seems the only thing that gets him excited is spending time with his daughter which is very hard for her as his wife to manage.  She explained he is a very task oriented person and just expects her to do what is expected and he does what is expected but there is no connection or intimacy currently in the relationship which is troubling for patient.  Patient stated that her initial thought was just to run but she does not want to do that because she does want her marriage.  Patient was encouraged to think if anything can be  triggered from her first marriage she shared that had similarities and being type a personalities but her first husband made sure that she knew he loved him.  She shared his drinking problem was what ended the marriage.  Patient was encouraged to communicate her confusion with her husband.  She was encouraged to explain how she is saying things to him and ask him what she is supposed to gain from his behavioral choices.  Patient stated that today is the fifth year anniversary of her brother-in-law's death which was very upsetting to all of them and she recognizes that could be playing a role in the situation with her husband but she also knows that she has to have some sort of intimacy in the relationship to continue.  Patient was encouraged to communicate with him rather than running from the situation.  Patient was also encouraged to continue working on her self-care and spending time with people she can connect with if her husband cannot connect with her   Interventions: Solution-Oriented/Positive Psychology  Diagnosis:   ICD-10-CM   1. Generalized anxiety disorder  F41.1     Plan: Patient is to utilize CBT and coping skills to help manage anxiety symptoms.  Patient is to communicate her confusion with her husband to see if they can find out a way to make sure that he is sending her  the message she wants to have her hear.  Patient is to continue exercising and spending time with her sister and her daughter to release anxiety in an appropriate manner. Long term goal: Resolve the core conflict that is the source of anxiety short-term goal: Identify the major life complex from the past and present the form the basis for present anxiety Increase understanding of beliefs and messages that produce the worry and anxiety   Alicia Myers, [facility]

## 2020-01-30 DIAGNOSIS — E890 Postprocedural hypothyroidism: Secondary | ICD-10-CM | POA: Diagnosis not present

## 2020-01-30 DIAGNOSIS — R232 Flushing: Secondary | ICD-10-CM | POA: Diagnosis not present

## 2020-01-30 DIAGNOSIS — G479 Sleep disorder, unspecified: Secondary | ICD-10-CM | POA: Diagnosis not present

## 2020-01-30 DIAGNOSIS — N951 Menopausal and female climacteric states: Secondary | ICD-10-CM | POA: Diagnosis not present

## 2020-02-10 ENCOUNTER — Ambulatory Visit: Payer: BC Managed Care – PPO | Admitting: Psychiatry

## 2020-02-12 DIAGNOSIS — L814 Other melanin hyperpigmentation: Secondary | ICD-10-CM | POA: Diagnosis not present

## 2020-02-12 DIAGNOSIS — L7211 Pilar cyst: Secondary | ICD-10-CM | POA: Diagnosis not present

## 2020-02-12 DIAGNOSIS — D22122 Melanocytic nevi of left lower eyelid, including canthus: Secondary | ICD-10-CM | POA: Diagnosis not present

## 2020-02-12 DIAGNOSIS — D2261 Melanocytic nevi of right upper limb, including shoulder: Secondary | ICD-10-CM | POA: Diagnosis not present

## 2020-02-24 ENCOUNTER — Ambulatory Visit: Payer: BC Managed Care – PPO | Admitting: Psychiatry

## 2020-03-24 ENCOUNTER — Ambulatory Visit: Payer: BC Managed Care – PPO | Admitting: Psychiatry

## 2020-04-27 DIAGNOSIS — N951 Menopausal and female climacteric states: Secondary | ICD-10-CM | POA: Diagnosis not present

## 2020-04-27 DIAGNOSIS — E890 Postprocedural hypothyroidism: Secondary | ICD-10-CM | POA: Diagnosis not present

## 2020-04-27 DIAGNOSIS — R232 Flushing: Secondary | ICD-10-CM | POA: Diagnosis not present

## 2020-04-27 DIAGNOSIS — G479 Sleep disorder, unspecified: Secondary | ICD-10-CM | POA: Diagnosis not present

## 2020-04-28 ENCOUNTER — Other Ambulatory Visit: Payer: Self-pay

## 2020-04-28 MED ORDER — PROGESTERONE MICRONIZED 100 MG PO CAPS: 100.0000 mg | ORAL_CAPSULE | Freq: Every day | ORAL | 0 refills | Status: DC

## 2020-04-29 DIAGNOSIS — G479 Sleep disorder, unspecified: Secondary | ICD-10-CM | POA: Diagnosis not present

## 2020-04-29 DIAGNOSIS — N951 Menopausal and female climacteric states: Secondary | ICD-10-CM | POA: Diagnosis not present

## 2020-04-29 DIAGNOSIS — R6882 Decreased libido: Secondary | ICD-10-CM | POA: Diagnosis not present

## 2020-04-29 DIAGNOSIS — R232 Flushing: Secondary | ICD-10-CM | POA: Diagnosis not present

## 2020-07-13 ENCOUNTER — Other Ambulatory Visit: Payer: Self-pay | Admitting: *Deleted

## 2020-07-16 ENCOUNTER — Other Ambulatory Visit: Payer: Self-pay

## 2020-07-16 NOTE — Telephone Encounter (Signed)
Past due for CE. Was due this past May. Rosemarie Ax called her and scheduled her for 08/19/20 with Dr. Delilah Shan.

## 2020-07-17 MED ORDER — LEVOTHYROXINE SODIUM 112 MCG PO TABS: 112.0000 ug | ORAL_TABLET | Freq: Every day | ORAL | 4 refills | Status: DC

## 2020-07-27 ENCOUNTER — Other Ambulatory Visit: Payer: Self-pay | Admitting: Obstetrics and Gynecology

## 2020-08-05 DIAGNOSIS — E890 Postprocedural hypothyroidism: Secondary | ICD-10-CM | POA: Diagnosis not present

## 2020-08-05 DIAGNOSIS — G479 Sleep disorder, unspecified: Secondary | ICD-10-CM | POA: Diagnosis not present

## 2020-08-05 DIAGNOSIS — N951 Menopausal and female climacteric states: Secondary | ICD-10-CM | POA: Diagnosis not present

## 2020-08-05 DIAGNOSIS — R232 Flushing: Secondary | ICD-10-CM | POA: Diagnosis not present

## 2020-08-10 DIAGNOSIS — R6882 Decreased libido: Secondary | ICD-10-CM | POA: Diagnosis not present

## 2020-08-10 DIAGNOSIS — R232 Flushing: Secondary | ICD-10-CM | POA: Diagnosis not present

## 2020-08-10 DIAGNOSIS — N951 Menopausal and female climacteric states: Secondary | ICD-10-CM | POA: Diagnosis not present

## 2020-08-19 ENCOUNTER — Encounter: Payer: BC Managed Care – PPO | Admitting: Obstetrics and Gynecology

## 2020-09-15 DIAGNOSIS — Z23 Encounter for immunization: Secondary | ICD-10-CM | POA: Diagnosis not present

## 2020-09-15 DIAGNOSIS — Z111 Encounter for screening for respiratory tuberculosis: Secondary | ICD-10-CM | POA: Diagnosis not present

## 2020-10-15 ENCOUNTER — Encounter: Payer: Self-pay | Admitting: Obstetrics and Gynecology

## 2020-10-15 DIAGNOSIS — Z111 Encounter for screening for respiratory tuberculosis: Secondary | ICD-10-CM | POA: Diagnosis not present

## 2020-10-15 DIAGNOSIS — Z1231 Encounter for screening mammogram for malignant neoplasm of breast: Secondary | ICD-10-CM | POA: Diagnosis not present

## 2020-10-27 ENCOUNTER — Encounter: Payer: Self-pay | Admitting: Obstetrics and Gynecology

## 2020-10-27 ENCOUNTER — Other Ambulatory Visit: Payer: Self-pay

## 2020-10-27 ENCOUNTER — Ambulatory Visit (INDEPENDENT_AMBULATORY_CARE_PROVIDER_SITE_OTHER): Payer: BC Managed Care – PPO | Admitting: Obstetrics and Gynecology

## 2020-10-27 VITALS — BP 118/76 | Ht 63.0 in | Wt 118.0 lb

## 2020-10-27 DIAGNOSIS — Z13220 Encounter for screening for lipoid disorders: Secondary | ICD-10-CM

## 2020-10-27 DIAGNOSIS — Z01419 Encounter for gynecological examination (general) (routine) without abnormal findings: Secondary | ICD-10-CM

## 2020-10-27 DIAGNOSIS — E039 Hypothyroidism, unspecified: Secondary | ICD-10-CM | POA: Diagnosis not present

## 2020-10-27 DIAGNOSIS — Z79890 Hormone replacement therapy (postmenopausal): Secondary | ICD-10-CM

## 2020-10-27 DIAGNOSIS — E559 Vitamin D deficiency, unspecified: Secondary | ICD-10-CM

## 2020-10-27 MED ORDER — ESTRADIOL 1 MG PO TABS: 1.0000 mg | ORAL_TABLET | Freq: Every day | ORAL | 4 refills | Status: DC

## 2020-10-27 MED ORDER — PROGESTERONE MICRONIZED 100 MG PO CAPS: 100.0000 mg | ORAL_CAPSULE | Freq: Every day | ORAL | 4 refills | Status: DC

## 2020-10-27 MED ORDER — ALPRAZOLAM 0.5 MG PO TABS: 0.5000 mg | ORAL_TABLET | Freq: Every evening | ORAL | 3 refills | Status: AC | PRN

## 2020-10-27 NOTE — Progress Notes (Signed)
Alicia Myers 10-22-1971 [id number]  SUBJECTIVE:  49 y.o. G2P2 female for annual routine gynecologic exam.  Did pellet therapy through [facility] and not happy with testosterone side effects including acne.  Also had estrogen pellets through them and would be due for her repeat injections but not planning to do this.  Had continue to take the Prometrium throughout, she had discontinued estradiol 1.5 mg daily to getting the pellets injected.  Not really having a problem daytime hot flashes, more trouble sleeping and significant night sweats.  She has no other gynecologic concerns.  No vaginal bleeding.  Current Outpatient Medications  Medication Sig Dispense Refill  . ALPRAZolam (XANAX) 0.5 MG tablet Take 1 tablet (0.5 mg total) by mouth 2 (two) times daily as needed. 60 tablet 3  . ALPRAZolam (XANAX) 0.5 MG tablet Take by mouth.    . levothyroxine (SYNTHROID) 112 MCG tablet Take 1 tablet (112 mcg total) by mouth daily. 90 tablet 4  . estradiol (ESTRACE) 1 MG tablet Use 1 1/2 tablets daily (Patient not taking: Reported on 10/27/2020) 120 tablet 4  . progesterone (PROMETRIUM) 100 MG capsule Take 1 capsule (100 mg total) by mouth at bedtime. (Patient not taking: Reported on 10/27/2020) 90 capsule 4   No current facility-administered medications for this visit.   Allergies: Sulfa antibiotics  Patient's last menstrual period was 09/18/2016.  Past medical history,surgical history, problem list, medications, allergies, family history and social history were all reviewed and documented as reviewed in the EPIC chart.  ROS: Pertinent positives and negatives as reviewed in HPI  OBJECTIVE:  BP 118/76   Ht 5\' 3"  (1.6 m)   Wt 118 lb (53.5 kg)   LMP 09/18/2016   BMI 20.90 kg/m  The patient appears well, alert, oriented, in no distress. ENT normal.  Neck supple. No cervical or supraclavicular adenopathy or neck mass. Lungs are clear, good air entry, no wheezes, rhonchi or rales. S1 and S2 normal,  no murmurs, regular rate and rhythm.  Abdomen soft without tenderness, guarding, mass or organomegaly.  Neurological is normal, no focal findings.  BREAST EXAM: breasts appear normal, no suspicious masses, no skin or nipple changes or axillary nodes, bilateral breast implants noted  PELVIC EXAM: VULVA: normal appearing vulva with no masses, tenderness or lesions, VAGINA: normal appearing vagina with normal color and discharge, no lesions, CERVIX: normal appearing cervix without discharge or lesions, UTERUS: uterus is normal size, shape, consistency and nontender, ADNEXA: normal adnexa in size, nontender and no masses  Chaperone: [name] present during the examination  ASSESSMENT:  49 y.o. G2P2 here for annual gynecologic exam  PLAN:   1. Postmenopausal/HRT.  Would rather get back on standard HRT due to significant night sweats.  Will rx estradiol 1 mg daily x 1 year.  Risks of HRT use as previously discussed include thrombosis such as heart attack, stroke, DVT, PE, and the breast cancer and uterine cancer issues.  Has supply of prometrium 100 mg at nighttime which she has continued to take.  Will let us know if her symptoms are not improving. 2. Pap smear 2019.  No significant history of abnormal Pap smears.  Next Pap smear due 2022 following the current guidelines recommending the 3 year interval. 3. Mammogram 09/2020.  Apparently she has very dense breast tissue and frequent callbacks for reimaging, breast MRIs are discussed again today.  Will let us know if she like to proceed with that order.  She has chosen just to follow with mammograms.  Normal breast exam today.   4. Colonoscopy never.  We discussed the recommendation for colon cancer screening to start at age 26.  She acknowledges the recommendations and will think about getting this scheduled. 5.  History of thyroid cancer and thyroidectomy.  Check TSH today.  Refill Synthroid 112 mcg was provided 07/17/2020. 6.  History of anxiety  sleep difficulty.  Uses half tablet of 0.5 mg Xanax sparingly.  #30 with 3 refills. 7.  Not currently taking vitamin D supplement.  Will check vitamin D level. 8. Health maintenance.  She will proceed to lab today for routine screening blood work (lipids, CBC, CMP), so will check TSH for history of iatrogenic hypothyroidism and a vitamin D level.  Return annually or sooner, prn.  Joseph Pierini MD 10/27/20

## 2020-10-28 LAB — COMPREHENSIVE METABOLIC PANEL
AG Ratio: 2.2 (calc) (ref 1.0–2.5)
ALT: 20 U/L (ref 6–29)
AST: 23 U/L (ref 10–35)
Albumin: 4.7 g/dL (ref 3.6–5.1)
Alkaline phosphatase (APISO): 58 U/L (ref 37–153)
BUN: 17 mg/dL (ref 7–25)
CO2: 27 mmol/L (ref 20–32)
Calcium: 9.4 mg/dL (ref 8.6–10.4)
Chloride: 100 mmol/L (ref 98–110)
Creat: 1.05 mg/dL (ref 0.50–1.05)
Globulin: 2.1 g/dL (calc) (ref 1.9–3.7)
Glucose, Bld: 83 mg/dL (ref 65–99)
Potassium: 4.1 mmol/L (ref 3.5–5.3)
Sodium: 138 mmol/L (ref 135–146)
Total Bilirubin: 0.5 mg/dL (ref 0.2–1.2)
Total Protein: 6.8 g/dL (ref 6.1–8.1)

## 2020-10-28 LAB — CBC
HCT: 44.4 % (ref 35.0–45.0)
Hemoglobin: 15.1 g/dL (ref 11.7–15.5)
MCH: 32.2 pg (ref 27.0–33.0)
MCHC: 34 g/dL (ref 32.0–36.0)
MCV: 94.7 fL (ref 80.0–100.0)
MPV: 10.7 fL (ref 7.5–12.5)
Platelets: 273 10*3/uL (ref 140–400)
RBC: 4.69 10*6/uL (ref 3.80–5.10)
RDW: 11.8 % (ref 11.0–15.0)
WBC: 4.9 10*3/uL (ref 3.8–10.8)

## 2020-10-28 LAB — TSH: TSH: 0.12 mIU/L — ABNORMAL LOW

## 2020-10-28 LAB — LIPID PANEL
Cholesterol: 211 mg/dL — ABNORMAL HIGH (ref <200)
HDL: 78 mg/dL (ref >=50)
LDL Cholesterol (Calc): 118 mg/dL (calc) — ABNORMAL HIGH
Non-HDL Cholesterol (Calc): 133 mg/dL (calc) — ABNORMAL HIGH (ref <130)
Total CHOL/HDL Ratio: 2.7 (calc) (ref <5.0)
Triglycerides: 62 mg/dL (ref <150)

## 2020-10-28 LAB — VITAMIN D 25 HYDROXY (VIT D DEFICIENCY, FRACTURES): Vit D, 25-Hydroxy: 19 ng/mL — ABNORMAL LOW (ref 30–100)

## 2020-11-06 ENCOUNTER — Other Ambulatory Visit: Payer: Self-pay

## 2020-11-06 DIAGNOSIS — R7989 Other specified abnormal findings of blood chemistry: Secondary | ICD-10-CM

## 2020-11-06 MED ORDER — LEVOTHYROXINE SODIUM 100 MCG PO TABS: 100.0000 ug | ORAL_TABLET | Freq: Every day | ORAL | 1 refills | Status: DC

## 2020-11-06 NOTE — Telephone Encounter (Signed)
I called patient because this message was unread. I read it to her. Rx sent for new Levothyroxine dose and TSH level order placed on file for her to return in 2 mos.

## 2021-01-01 ENCOUNTER — Other Ambulatory Visit: Payer: Self-pay | Admitting: Obstetrics and Gynecology

## 2021-01-01 DIAGNOSIS — R7989 Other specified abnormal findings of blood chemistry: Secondary | ICD-10-CM

## 2021-01-06 ENCOUNTER — Encounter: Payer: Self-pay | Admitting: Obstetrics and Gynecology

## 2021-01-06 ENCOUNTER — Telehealth: Payer: BC Managed Care – PPO | Admitting: Family

## 2021-01-06 DIAGNOSIS — B9689 Other specified bacterial agents as the cause of diseases classified elsewhere: Secondary | ICD-10-CM

## 2021-01-06 DIAGNOSIS — J208 Acute bronchitis due to other specified organisms: Secondary | ICD-10-CM | POA: Diagnosis not present

## 2021-01-06 DIAGNOSIS — Z803 Family history of malignant neoplasm of breast: Secondary | ICD-10-CM

## 2021-01-06 DIAGNOSIS — R922 Inconclusive mammogram: Secondary | ICD-10-CM

## 2021-01-06 MED ORDER — AZITHROMYCIN 250 MG PO TABS: ORAL_TABLET | ORAL | 0 refills | Status: DC

## 2021-01-06 MED ORDER — PREDNISONE 10 MG (21) PO TBPK: ORAL_TABLET | ORAL | 0 refills | Status: DC

## 2021-01-06 NOTE — Progress Notes (Signed)
We are sorry that you are not feeling well.  Here is how we plan to help!  Based on your presentation I believe you most likely have A cough due to bacteria.  When patients have a fever and a productive cough with a change in color or increased sputum production, we are concerned about bacterial bronchitis.  If left untreated it can progress to pneumonia.  If your symptoms do not improve with your treatment plan it is important that you contact your provider.   I have prescribed Azithromyin 250 mg: two tablets now and then one tablet daily for 4 additonal days    In addition you may use A non-prescription cough medication called Robitussin DAC. Take 2 teaspoons every 8 hours or Delsym: take 2 teaspoons every 12 hours., A non-prescription cough medication called Mucinex DM: take 2 tablets every 12 hours. and A prescription cough medication called Tessalon Perles 100mg. You may take 1-2 capsules every 8 hours as needed for your cough.  Prednisone 10 mg daily for 6 days (see taper instructions below)  Directions for 6 day taper: Day 1: 2 tablets before breakfast, 1 after both lunch & dinner and 2 at bedtime Day 2: 1 tab before breakfast, 1 after both lunch & dinner and 2 at bedtime Day 3: 1 tab at each meal & 1 at bedtime Day 4: 1 tab at breakfast, 1 at lunch, 1 at bedtime Day 5: 1 tab at breakfast & 1 tab at bedtime Day 6: 1 tab at breakfast   From your responses in the eVisit questionnaire you describe inflammation in the upper respiratory tract which is causing a significant cough.  This is commonly called Bronchitis and has four common causes:    Allergies  Viral Infections  Acid Reflux  Bacterial Infection Allergies, viruses and acid reflux are treated by controlling symptoms or eliminating the cause. An example might be a cough caused by taking certain blood pressure medications. You stop the cough by changing the medication. Another example might be a cough caused by acid reflux.  Controlling the reflux helps control the cough.  USE OF BRONCHODILATOR ("RESCUE") INHALERS: There is a risk from using your bronchodilator too frequently.  The risk is that over-reliance on a medication which only relaxes the muscles surrounding the breathing tubes can reduce the effectiveness of medications prescribed to reduce swelling and congestion of the tubes themselves.  Although you feel brief relief from the bronchodilator inhaler, your asthma may actually be worsening with the tubes becoming more swollen and filled with mucus.  This can delay other crucial treatments, such as oral steroid medications. If you need to use a bronchodilator inhaler daily, several times per day, you should discuss this with your provider.  There are probably better treatments that could be used to keep your asthma under control.     HOME CARE . Only take medications as instructed by your medical team. . Complete the entire course of an antibiotic. . Drink plenty of fluids and get plenty of rest. . Avoid close contacts especially the very young and the elderly . Cover your mouth if you cough or cough into your sleeve. . Always remember to wash your hands . A steam or ultrasonic humidifier can help congestion.   GET HELP RIGHT AWAY IF: . You develop worsening fever. . You become short of breath . You cough up blood. . Your symptoms persist after you have completed your treatment plan MAKE SURE YOU   Understand these instructions.    Will watch your condition.  Will get help right away if you are not doing well or get worse.  Your e-visit answers were reviewed by a board certified advanced clinical practitioner to complete your personal care plan.  Depending on the condition, your plan could have included both over the counter or prescription medications. If there is a problem please reply  once you have received a response from your provider. Your safety is important to us.  If you have drug allergies  check your prescription carefully.    You can use MyChart to ask questions about today's visit, request a non-urgent call back, or ask for a work or school excuse for 24 hours related to this e-Visit. If it has been greater than 24 hours you will need to follow up with your provider, or enter a new e-Visit to address those concerns. You will get an e-mail in the next two days asking about your experience.  I hope that your e-visit has been valuable and will speed your recovery. Thank you for using e-visits.  Approximately 5 minutes was spent documenting and reviewing patient's chart.    

## 2021-01-06 NOTE — Telephone Encounter (Signed)
Please have the patient schedule a breast MRI

## 2021-01-07 ENCOUNTER — Other Ambulatory Visit: Payer: Self-pay

## 2021-01-07 DIAGNOSIS — R7989 Other specified abnormal findings of blood chemistry: Secondary | ICD-10-CM

## 2021-01-13 ENCOUNTER — Other Ambulatory Visit: Payer: Self-pay

## 2021-01-13 ENCOUNTER — Other Ambulatory Visit (INDEPENDENT_AMBULATORY_CARE_PROVIDER_SITE_OTHER): Payer: BC Managed Care – PPO

## 2021-01-13 ENCOUNTER — Other Ambulatory Visit: Payer: Self-pay | Admitting: Obstetrics and Gynecology

## 2021-01-13 DIAGNOSIS — R7989 Other specified abnormal findings of blood chemistry: Secondary | ICD-10-CM

## 2021-01-13 LAB — TSH: TSH: 3.18 mIU/L

## 2021-01-21 ENCOUNTER — Ambulatory Visit
Admission: RE | Admit: 2021-01-21 | Discharge: 2021-01-21 | Disposition: A | Discharge status: Home / Self Care | Payer: BC Managed Care – PPO | Source: Ambulatory Visit | Attending: Obstetrics and Gynecology | Admitting: Obstetrics and Gynecology

## 2021-01-21 DIAGNOSIS — R922 Inconclusive mammogram: Secondary | ICD-10-CM

## 2021-01-21 DIAGNOSIS — N6489 Other specified disorders of breast: Secondary | ICD-10-CM | POA: Diagnosis not present

## 2021-01-21 DIAGNOSIS — Z803 Family history of malignant neoplasm of breast: Secondary | ICD-10-CM

## 2021-01-21 MED ORDER — GADOBUTROL 1 MMOL/ML IV SOLN
5.0000 mL | Freq: Once | INTRAVENOUS | Status: AC | PRN
  Administered 2021-01-21: 5 mL via INTRAVENOUS

## 2021-01-25 ENCOUNTER — Other Ambulatory Visit: Payer: Self-pay | Admitting: *Deleted

## 2021-01-25 DIAGNOSIS — R7989 Other specified abnormal findings of blood chemistry: Secondary | ICD-10-CM

## 2021-01-25 NOTE — Telephone Encounter (Signed)
Medication refill request: synthroid Last AEX:  10-27-20 JK Next AEX: not currently scheduled Last MMG (if hormonal medication request): n/a Refill authorized: today, please advise.

## 2021-01-26 MED ORDER — LEVOTHYROXINE SODIUM 100 MCG PO TABS: 100.0000 ug | ORAL_TABLET | Freq: Every day | ORAL | 0 refills | Status: DC

## 2021-04-19 ENCOUNTER — Other Ambulatory Visit: Payer: Self-pay | Admitting: Nurse Practitioner

## 2021-04-19 ENCOUNTER — Other Ambulatory Visit: Payer: Self-pay | Admitting: *Deleted

## 2021-04-19 DIAGNOSIS — E039 Hypothyroidism, unspecified: Secondary | ICD-10-CM

## 2021-04-19 DIAGNOSIS — R7989 Other specified abnormal findings of blood chemistry: Secondary | ICD-10-CM

## 2021-04-19 NOTE — Telephone Encounter (Signed)
Message left to return call to [place] at [phone].   Patient needs to schedule lab appointment for TSH recheck.

## 2021-04-19 NOTE — Telephone Encounter (Signed)
Medication refill request: Synthroid  Last AEX:  10-27-20 JK Next AEX: not currently scheduled Last MMG (if hormonal medication request): n/a Refill authorized: Today, please advise.   Medication pended for #90, 1RF. Please refill if appropriate.

## 2021-04-19 NOTE — Telephone Encounter (Signed)
I do recommend she return for TSH recheck. If she needs enough to get her to that appointment I will provide it. Thank you.

## 2021-04-26 MED ORDER — LEVOTHYROXINE SODIUM 100 MCG PO TABS: 100.0000 ug | ORAL_TABLET | Freq: Every day | ORAL | 1 refills | Status: DC

## 2021-04-26 NOTE — Telephone Encounter (Signed)
Since she has not been taking we will not get a true reading at this time. I sent in 2 months of refills. I recommend she come in at 6 weeks after restarting for TSH recheck.

## 2021-04-26 NOTE — Telephone Encounter (Signed)
Patient left voicemail on Triage line stating she has been out of medication for 5 days and unsure when she can come in for an appointment due to traveling. Please advise.

## 2021-04-26 NOTE — Telephone Encounter (Signed)
Detailed message left advising patient of message as seen below from [place]. Advised patient to return call to Triage office.

## 2021-05-10 DIAGNOSIS — L7211 Pilar cyst: Secondary | ICD-10-CM | POA: Diagnosis not present

## 2021-05-10 DIAGNOSIS — L821 Other seborrheic keratosis: Secondary | ICD-10-CM | POA: Diagnosis not present

## 2021-05-10 DIAGNOSIS — D2261 Melanocytic nevi of right upper limb, including shoulder: Secondary | ICD-10-CM | POA: Diagnosis not present

## 2021-05-10 DIAGNOSIS — L918 Other hypertrophic disorders of the skin: Secondary | ICD-10-CM | POA: Diagnosis not present

## 2021-05-12 DIAGNOSIS — N951 Menopausal and female climacteric states: Secondary | ICD-10-CM | POA: Diagnosis not present

## 2021-05-13 ENCOUNTER — Telehealth: Payer: Self-pay

## 2021-05-13 NOTE — Telephone Encounter (Signed)
Patient is calling and wanted to see if provider would accept her back as a new patient, please advise. CB is [phone]

## 2021-05-13 NOTE — Telephone Encounter (Signed)
Will you except this patient back?  Is it okay to schedule her for a new patient?

## 2021-05-13 NOTE — Telephone Encounter (Signed)
Patient is scheduled 05/13/2021 at 2 PM

## 2021-05-14 ENCOUNTER — Ambulatory Visit (INDEPENDENT_AMBULATORY_CARE_PROVIDER_SITE_OTHER): Payer: BC Managed Care – PPO | Admitting: Family Medicine

## 2021-05-14 ENCOUNTER — Other Ambulatory Visit: Payer: Self-pay

## 2021-05-14 ENCOUNTER — Ambulatory Visit: Payer: BC Managed Care – PPO | Admitting: Family Medicine

## 2021-05-14 ENCOUNTER — Encounter: Payer: Self-pay | Admitting: Family Medicine

## 2021-05-14 VITALS — BP 118/60 | HR 64 | Temp 97.8°F | Wt 123.7 lb

## 2021-05-14 DIAGNOSIS — Z0000 Encounter for general adult medical examination without abnormal findings: Secondary | ICD-10-CM

## 2021-05-14 DIAGNOSIS — R7989 Other specified abnormal findings of blood chemistry: Secondary | ICD-10-CM

## 2021-05-14 DIAGNOSIS — E039 Hypothyroidism, unspecified: Secondary | ICD-10-CM

## 2021-05-14 MED ORDER — LEVOTHYROXINE SODIUM 100 MCG PO TABS: 100.0000 ug | ORAL_TABLET | Freq: Every day | ORAL | 3 refills | Status: DC

## 2021-05-14 NOTE — Patient Instructions (Signed)
Hypothyroidism  Hypothyroidism is when the thyroid gland does not make enough of certain hormones (it is underactive). The thyroid gland is a small gland located in the lower front part of the neck, just in front of the windpipe (trachea). This gland makes hormones that help control how the body uses food for energy (metabolism) as well as how the heart and brain function. These hormones also play a role in keeping your bones strong. When the thyroid is underactive, it produces too little of the hormones thyroxine (T4) and triiodothyronine (T3). What are the causes? This condition may be caused by:  Hashimoto's disease. This is a disease in which the body's disease-fighting system (immune system) attacks the thyroid gland. This is the most common cause.  Viral infections.  Pregnancy.  Certain medicines.  Birth defects.  Past radiation treatments to the head or neck for cancer.  Past treatment with radioactive iodine.  Past exposure to radiation in the environment.  Past surgical removal of part or all of the thyroid.  Problems with a gland in the center of the brain (pituitary gland).  Lack of enough iodine in the diet. What increases the risk? You are more likely to develop this condition if:  You are female.  You have a family history of thyroid conditions.  You use a medicine called lithium.  You take medicines that affect the immune system (immunosuppressants). What are the signs or symptoms? Symptoms of this condition include:  Feeling as though you have no energy (lethargy).  Not being able to tolerate cold.  Weight gain that is not explained by a change in diet or exercise habits.  Lack of appetite.  Dry skin.  Coarse hair.  Menstrual irregularity.  Slowing of thought processes.  Constipation.  Sadness or depression. How is this diagnosed? This condition may be diagnosed based on:  Your symptoms, your medical history, and a physical exam.  Blood  tests. You may also have imaging tests, such as an ultrasound or MRI. How is this treated? This condition is treated with medicine that replaces the thyroid hormones that your body does not make. After you begin treatment, it may take several weeks for symptoms to go away. Follow these instructions at home:  Take over-the-counter and prescription medicines only as told by your health care provider.  If you start taking any new medicines, tell your health care provider.  Keep all follow-up visits as told by your health care provider. This is important. ? As your condition improves, your dosage of thyroid hormone medicine may change. ? You will need to have blood tests regularly so that your health care provider can monitor your condition. Contact a health care provider if:  Your symptoms do not get better with treatment.  You are taking thyroid hormone replacement medicine and you: ? Sweat a lot. ? Have tremors. ? Feel anxious. ? Lose weight rapidly. ? Cannot tolerate heat. ? Have emotional swings. ? Have diarrhea. ? Feel weak. Get help right away if you have:  Chest pain.  An irregular heartbeat.  A rapid heartbeat.  Difficulty breathing. Summary  Hypothyroidism is when the thyroid gland does not make enough of certain hormones (it is underactive).  When the thyroid is underactive, it produces too little of the hormones thyroxine (T4) and triiodothyronine (T3).  The most common cause is Hashimoto's disease, a disease in which the body's disease-fighting system (immune system) attacks the thyroid gland. The condition can also be caused by viral infections, medicine, pregnancy, or   past radiation treatment to the head or neck.  Symptoms may include weight gain, dry skin, constipation, feeling as though you do not have energy, and not being able to tolerate cold.  This condition is treated with medicine to replace the thyroid hormones that your body does not make. This  information is not intended to replace advice given to you by your health care provider. Make sure you discuss any questions you have with your health care provider. Document Revised: 09/04/2020 Document Reviewed: 08/20/2020 [employer] Patient Education  2021 [employer]  

## 2021-05-14 NOTE — Progress Notes (Signed)
Established Patient Office Visit  Subjective:  Patient ID: Alicia Myers, female    DOB: 04-30-1971  Age: 50 y.o. MRN: [id number]  CC:  Chief Complaint  Patient presents with  . Establish Care    Would like lymph nodes in neck checked, has not had any issues but was told by her massage Therapist  to have them checked. OBGYN will not longer check TSH and prescribe synthroid    HPI Alicia Myers presents for reestablishing care.  She sees gynecologist regularly.  She had been getting her thyroid medication through GYN but she has due to gynecologist who will not prescribe this.  She actually had labs done in January with normal TSH level then.  She takes levothyroxine 100 mcg daily.  She states her TSH levels been very stable for years.  She also went for massage recently.  She was told that she may have some adenopathy in her neck and was told to get follow-up.  She feels well.  No recent appetite or weight changes.  Does have some hot flashes related to post menopause but otherwise feels well.  No sore throat.  No recent fever.  Past Medical History:  Diagnosis Date  . Biceps tendon tear   . 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 AUGMENTATION MAMMAPLASTY  2007  . THYROIDECTOMY     PAPILLARY THYROID CANCER  . TONSILLECTOMY      Family History  Problem Relation Age of Onset  . Heart disease Paternal Grandfather 82       heart attack  . Diabetes Father   . Breast cancer Paternal Aunt 63  . Diabetes Mother     Social History   Socioeconomic History  . Marital status: Married    Spouse name: Not on file  . Number of children: Not on file  . Years of education: Not on file  . Highest education level: Not on file  Occupational History  . Not on file  Tobacco Use  . Smoking status: Never Smoker  . Smokeless tobacco: Never Used  Vaping Use  . Vaping Use: Never used  Substance and  Sexual Activity  . Alcohol use: Yes    Alcohol/week: 2.0 standard drinks    Types: 2 Standard drinks or equivalent per week  . Drug use: No  . Sexual activity: Yes    Birth control/protection: Post-menopausal    Comment: 1st intercourse 50 yo-Fewer than 5 partners  Other Topics Concern  . Not on file  Social History Narrative  . Not on file   Social Determinants of Health   Financial Resource Strain: Not on file  Food Insecurity: Not on file  Transportation Needs: Not on file  Physical Activity: Not on file  Stress: Not on file  Social Connections: Not on file  Intimate Partner Violence: Not on file    Outpatient Medications Prior to Visit  Medication Sig Dispense Refill  . ALPRAZolam (XANAX) 0.5 MG tablet Take 1 tablet (0.5 mg total) by mouth at bedtime as needed for anxiety or sleep. 30 tablet 3  . estradiol (VIVELLE-DOT) 0.05 MG/24HR patch Place 1 patch onto the skin 2 (two) times a week.    . progesterone (PROMETRIUM) 200 MG capsule Take 200 mg by mouth at bedtime.    . ALPRAZolam (XANAX) 0.5 MG tablet Take 1 tablet (0.5 mg total) by mouth 2 (two) times daily as needed. 60 tablet  3  . azithromycin (ZITHROMAX) 250 MG tablet Take 500 mg once, then 250 mg for four days 6 tablet 0  . estradiol (ESTRACE) 1 MG tablet Take 1 tablet (1 mg total) by mouth daily. 90 tablet 4  . levothyroxine (SYNTHROID) 100 MCG tablet Take 1 tablet (100 mcg total) by mouth daily before breakfast. 30 tablet 1  . predniSONE (STERAPRED UNI-PAK 21 TAB) 10 MG (21) TBPK tablet Use as directed 21 tablet 0  . progesterone (PROMETRIUM) 100 MG capsule Take 1 capsule (100 mg total) by mouth at bedtime. (Patient not taking: No sig reported) 90 capsule 4  . progesterone (PROMETRIUM) 100 MG capsule Take 1 capsule (100 mg total) by mouth daily. 90 capsule 4   No facility-administered medications prior to visit.    Allergies  Allergen Reactions  . Sulfa Antibiotics Hives and Other (See Comments)    Passed out     ROS Review of Systems  Constitutional: Negative for appetite change, chills, fever and unexpected weight change.  Respiratory: Negative for cough and shortness of breath.   Cardiovascular: Negative for chest pain.  Endocrine: Negative for cold intolerance.      Objective:    Physical Exam Vitals reviewed.  Constitutional:      Appearance: Normal appearance.  Neck:     Comments: No thyromegaly Cardiovascular:     Rate and Rhythm: Normal rate and regular rhythm.  Pulmonary:     Effort: Pulmonary effort is normal.     Breath sounds: Normal breath sounds.  Musculoskeletal:     Cervical back: Neck supple.  Lymphadenopathy:     Cervical: No cervical adenopathy.  Neurological:     Mental Status: She is alert.     BP 118/60 (BP Location: Left Arm, Patient Position: Sitting, Cuff Size: Normal)   Pulse 64   Temp 97.8 F (36.6 C) (Oral)   Wt 123 lb 11.2 oz (56.1 kg)   LMP 09/18/2016   SpO2 99%   BMI 21.91 kg/m  Wt Readings from Last 3 Encounters:  05/14/21 123 lb 11.2 oz (56.1 kg)  10/27/20 118 lb (53.5 kg)  04/29/19 126 lb (57.2 kg)     Health Maintenance Due  Topic Date Due  . HIV Screening  Never done  . Hepatitis C Screening  Never done  . COLONOSCOPY (Pts 45-48yrs Insurance coverage will need to be confirmed)  Never done  . Zoster Vaccines- Shingrix (1 of 2) Never done  . COVID-19 Vaccine (2 - Moderna risk 4-dose series) 03/27/2020  . PAP SMEAR-Modifier  03/21/2021    There are no preventive care reminders to display for this patient.  Lab Results  Component Value Date   TSH 3.18 01/13/2021   Lab Results  Component Value Date   WBC 4.9 10/27/2020   HGB 15.1 10/27/2020   HCT 44.4 10/27/2020   MCV 94.7 10/27/2020   PLT 273 10/27/2020   Lab Results  Component Value Date   NA 138 10/27/2020   K 4.1 10/27/2020   CO2 27 10/27/2020   GLUCOSE 83 10/27/2020   BUN 17 10/27/2020   CREATININE 1.05 10/27/2020   BILITOT 0.5 10/27/2020   ALKPHOS 51  01/31/2017   AST 23 10/27/2020   ALT 20 10/27/2020   PROT 6.8 10/27/2020   ALBUMIN 4.4 01/31/2017   CALCIUM 9.4 10/27/2020   Lab Results  Component Value Date   CHOL 211 (H) 10/27/2020   Lab Results  Component Value Date   HDL 78 10/27/2020   Lab Results  Component Value Date   LDLCALC 118 (H) 10/27/2020   Lab Results  Component Value Date   TRIG 62 10/27/2020   Lab Results  Component Value Date   CHOLHDL 2.7 10/27/2020   No results found for: HGBA1C    Assessment & Plan:   #1 hypothyroidism.  Adequately replaced by labs back in January of this year. -Refilled levothyroxine for 1 year.  We do recommend follow-up within 1 year to recheck labs  #2 recent concern recently for possible anterior cervical adenopathy.  No significant adenopathy noted at this time.  She also did not have any evidence for supraclavicular or posterior cervical adenopathy. -Reassurance and observation  Meds ordered this encounter  Medications  . levothyroxine (SYNTHROID) 100 MCG tablet    Sig: Take 1 tablet (100 mcg total) by mouth daily before breakfast.    Dispense:  90 tablet    Refill:  3    Follow-up: No follow-ups on file.    Carolann Littler, MD

## 2021-06-22 ENCOUNTER — Telehealth: Payer: BC Managed Care – PPO | Admitting: Physician Assistant

## 2021-06-22 ENCOUNTER — Encounter: Payer: Self-pay | Admitting: Physician Assistant

## 2021-06-22 ENCOUNTER — Encounter: Payer: Self-pay | Admitting: Family Medicine

## 2021-06-22 DIAGNOSIS — Z01818 Encounter for other preprocedural examination: Secondary | ICD-10-CM

## 2021-06-22 NOTE — Progress Notes (Signed)
[name], donofrio are scheduled for a virtual visit with your provider today.    Just as we do with appointments in the office, we must obtain your consent to participate.  Your consent will be active for this visit and any virtual visit you may have with one of our providers in the next 365 days.    If you have a MyChart account, I can also send a copy of this consent to you electronically.  All virtual visits are billed to your insurance company just like a traditional visit in the office.  As this is a virtual visit, video technology does not allow for your provider to perform a traditional examination.  This may limit your provider's ability to fully assess your condition.  If your provider identifies any concerns that need to be evaluated in person or the need to arrange testing such as labs, EKG, etc, we will make arrangements to do so.    Although advances in technology are sophisticated, we cannot ensure that it will always work on either your end or our end.  If the connection with a video visit is poor, we may have to switch to a telephone visit.  With either a video or telephone visit, we are not always able to ensure that we have a secure connection.   I need to obtain your verbal consent now.   Are you willing to proceed with your visit today?   TAZIAH DIFATTA has provided verbal consent on 06/22/2021 for a virtual visit (video or telephone).   Mar Daring, PA-C 06/22/2021  12:53 PM  Virtual Visit Consent   Chrystie Nose, you are scheduled for a virtual visit with a [facility] provider today.     Just as with appointments in the office, your consent must be obtained to participate.  Your consent will be active for this visit and any virtual visit you may have with one of our providers in the next 365 days.     If you have a MyChart account, a copy of this consent can be sent to you electronically.  All virtual visits are billed to your insurance company just like a  traditional visit in the office.    As this is a virtual visit, video technology does not allow for your provider to perform a traditional examination.  This may limit your provider's ability to fully assess your condition.  If your provider identifies any concerns that need to be evaluated in person or the need to arrange testing (such as labs, EKG, etc.), we will make arrangements to do so.     Although advances in technology are sophisticated, we cannot ensure that it will always work on either your end or our end.  If the connection with a video visit is poor, the visit may have to be switched to a telephone visit.  With either a video or telephone visit, we are not always able to ensure that we have a secure connection.     I need to obtain your verbal consent now.   Are you willing to proceed with your visit today?    BROGAN ENGLAND has provided verbal consent on 06/22/2021 for a virtual visit (video or telephone).   Mar Daring, PA-C   Date: 06/22/2021 12:53 PM   Virtual Visit via Video Note   IMar Daring, connected with  NYLEAH MCGINNIS  ([id number], Feb 12, 1971) on 06/22/21 at 12:45 PM EDT by a video-enabled telemedicine application and  verified that I am speaking with the correct person using two identifiers.  Location: Patient: Virtual Visit Location Patient: Home Provider: Virtual Visit Location Provider: Home Office   I discussed the limitations of evaluation and management by telemedicine and the availability of in person appointments. The patient expressed understanding and agreed to proceed.    History of Present Illness: Alicia Myers is a 50 y.o. who identifies as a female who was assigned female at birth, and is being seen today for questions about pre op paperwork. She is scheduled for repeat breast augmentation on 08/17/21 and her plastic surgeon has requested a pre-op clearance. She was seen by her PCP at the end of may and is curious if she will  need to be seen again.   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[place]) 09/09/2019   Anxiety 04/26/2012   Hx of thyroid cancer 04/26/2012   Hypothyroidism 04/26/2012   Biceps tendon tear    Gastrocnemius muscle tear 08/04/2011   Iliotibial band tendonitis 08/04/2011   Leg pain 05/19/2011    Allergies:  Allergies  Allergen Reactions   Sulfa Antibiotics Hives and Other (See Comments)    Passed out   Medications:  Current Outpatient Medications:    ALPRAZolam (XANAX) 0.5 MG tablet, Take 1 tablet (0.5 mg total) by mouth at bedtime as needed for anxiety or sleep., Disp: 30 tablet, Rfl: 3   estradiol (VIVELLE-DOT) 0.05 MG/24HR patch, Place 1 patch onto the skin 2 (two) times a week., Disp: , Rfl:    levothyroxine (SYNTHROID) 100 MCG tablet, Take 1 tablet (100 mcg total) by mouth daily before breakfast., Disp: 90 tablet, Rfl: 3   progesterone (PROMETRIUM) 200 MG capsule, Take 200 mg by mouth at bedtime., Disp: , Rfl:   Observations/Objective: Patient is well-developed, well-nourished in no acute distress.  Resting comfortably at home.  Head is normocephalic, atraumatic.  No labored breathing. Speech is clear and coherent with logical content.  Patient is alert and oriented at baseline.   Assessment and Plan: 1. Pre-op testing - Advised patient we were not affiliated with her PCP and she should reach out to them about the pre-op clearance and if she will need an appointment. I am suspecting she will need to be scheduled for the EKG at the least. She voiced understanding and agreed to send a MyChart message to her PCP.  Follow Up Instructions: I discussed the assessment and treatment plan with the patient. The patient was provided an opportunity to ask questions and all were answered. The patient agreed with the plan and demonstrated an understanding of the instructions.  A copy of instructions were sent to the patient via  MyChart.  The patient was advised to call back or seek an in-person evaluation if the symptoms worsen or if the condition fails to improve as anticipated.  Time:  I spent 6 minutes with the patient via telehealth technology discussing the above problems/concerns.    Mar Daring, PA-C

## 2021-06-30 ENCOUNTER — Ambulatory Visit (INDEPENDENT_AMBULATORY_CARE_PROVIDER_SITE_OTHER): Payer: BC Managed Care – PPO | Admitting: Family Medicine

## 2021-06-30 ENCOUNTER — Other Ambulatory Visit: Payer: Self-pay

## 2021-06-30 ENCOUNTER — Encounter: Payer: Self-pay | Admitting: Family Medicine

## 2021-06-30 VITALS — BP 118/64 | HR 64 | Temp 98.1°F | Ht 63.0 in | Wt 122.8 lb

## 2021-06-30 DIAGNOSIS — Z01810 Encounter for preprocedural cardiovascular examination: Secondary | ICD-10-CM | POA: Diagnosis not present

## 2021-06-30 LAB — COMPREHENSIVE METABOLIC PANEL
ALT: 15 U/L (ref 0–35)
AST: 20 U/L (ref 0–37)
Albumin: 4.5 g/dL (ref 3.5–5.2)
Alkaline Phosphatase: 53 U/L (ref 39–117)
BUN: 16 mg/dL (ref 6–23)
CO2: 28 mEq/L (ref 19–32)
Calcium: 9.2 mg/dL (ref 8.4–10.5)
Chloride: 103 mEq/L (ref 96–112)
Creatinine, Ser: 1.1 mg/dL (ref 0.40–1.20)
GFR: 58.12 mL/min — ABNORMAL LOW (ref >60.00)
Glucose, Bld: 89 mg/dL (ref 70–99)
Potassium: 3.9 mEq/L (ref 3.5–5.1)
Sodium: 137 mEq/L (ref 135–145)
Total Bilirubin: 0.6 mg/dL (ref 0.2–1.2)
Total Protein: 6.4 g/dL (ref 6.0–8.3)

## 2021-06-30 LAB — APTT: aPTT: 25 s (ref 23.4–32.7)

## 2021-06-30 LAB — PROTIME-INR
INR: 1 ratio (ref 0.8–1.0)
Prothrombin Time: 11.2 s (ref 9.6–13.1)

## 2021-06-30 LAB — HEMOGLOBIN A1C: Hgb A1c MFr Bld: 5.2 % (ref 4.6–6.5)

## 2021-06-30 NOTE — Progress Notes (Signed)
Established Patient Office Visit  Subjective:  Patient ID: Alicia Myers, female    DOB: 1971/09/13  Age: 50 y.o. MRN: [id number]  CC:  Chief Complaint  Patient presents with   surgical clearance    HPI [name] presents for preoperative evaluation.  She has history of breast implants and is looking at revision surgery in August.  Generally very healthy.  She does have past history of papillary thyroid cancer and has had previous thyroidectomy.  Surgeon is specifically requesting EKG, A1c, CBC, CMP, INR, and PTT.  She is on regular thyroid replacement.  TSH in January was normal.  She has no cardiac history.  No chronic lung issues.  Feels well.  Exercises vigorously about 60 minutes/day most days of the week.  Past Medical History:  Diagnosis Date   Biceps tendon tear    Thyroid cancer ([place])    PAPILLARY THYROID CA --THYROIDECTOMY    Past Surgical History:  Procedure Laterality Date   AUGMENTATION MAMMAPLASTY  2007   THYROIDECTOMY     PAPILLARY THYROID CANCER   TONSILLECTOMY      Family History  Problem Relation Age of Onset   Heart disease Paternal Grandfather 72       heart attack   Diabetes Father    Breast cancer Paternal Aunt 71   Diabetes Mother     Social History   Socioeconomic History   Marital status: Married    Spouse name: Not on file   Number of children: Not on file   Years of education: Not on file   Highest education level: Not on file  Occupational History   Not on file  Tobacco Use   Smoking status: Never   Smokeless tobacco: Never  Vaping Use   Vaping Use: Never used  Substance and Sexual Activity   Alcohol use: Yes    Alcohol/week: 2.0 standard drinks    Types: 2 Standard drinks or equivalent per week   Drug use: No   Sexual activity: Yes    Birth control/protection: Post-menopausal    Comment: 1st intercourse 50 yo-Fewer than 5 partners  Other Topics Concern   Not on file  Social History Narrative   Not on file    Social Determinants of Health   Financial Resource Strain: Not on file  Food Insecurity: Not on file  Transportation Needs: Not on file  Physical Activity: Not on file  Stress: Not on file  Social Connections: Not on file  Intimate Partner Violence: Not on file    Outpatient Medications Prior to Visit  Medication Sig Dispense Refill   ALPRAZolam (XANAX) 0.5 MG tablet Take 1 tablet (0.5 mg total) by mouth at bedtime as needed for anxiety or sleep. 30 tablet 3   estradiol (VIVELLE-DOT) 0.05 MG/24HR patch Place 1 patch onto the skin 2 (two) times a week.     levothyroxine (SYNTHROID) 100 MCG tablet Take 1 tablet (100 mcg total) by mouth daily before breakfast. 90 tablet 3   progesterone (PROMETRIUM) 200 MG capsule Take 200 mg by mouth at bedtime.     No facility-administered medications prior to visit.    Allergies  Allergen Reactions   Sulfa Antibiotics Hives and Other (See Comments)    Passed out    ROS Review of Systems  Constitutional:  Negative for fatigue.  Eyes:  Negative for visual disturbance.  Respiratory:  Negative for cough, chest tightness, shortness of breath and wheezing.   Cardiovascular:  Negative for chest pain, palpitations and  leg swelling.  Neurological:  Negative for dizziness, seizures, syncope, weakness, light-headedness and headaches.     Objective:    Physical Exam Constitutional:      Appearance: She is well-developed.  Eyes:     Pupils: Pupils are equal, round, and reactive to light.  Neck:     Thyroid: No thyromegaly.     Vascular: No JVD.  Cardiovascular:     Rate and Rhythm: Normal rate and regular rhythm.     Heart sounds:    No gallop.  Pulmonary:     Effort: Pulmonary effort is normal. No respiratory distress.     Breath sounds: Normal breath sounds. No wheezing or rales.  Musculoskeletal:     Cervical back: Neck supple.  Neurological:     Mental Status: She is alert.    BP 118/64 (BP Location: Left Arm, Patient Position:  Sitting, Cuff Size: Normal)   Pulse 64   Temp 98.1 F (36.7 C) (Oral)   Ht 5\' 3"  (1.6 m)   Wt 122 lb 12.8 oz (55.7 kg)   LMP 09/18/2016   SpO2 97%   BMI 21.75 kg/m  Wt Readings from Last 3 Encounters:  06/30/21 122 lb 12.8 oz (55.7 kg)  05/14/21 123 lb 11.2 oz (56.1 kg)  10/27/20 118 lb (53.5 kg)     Health Maintenance Due  Topic Date Due   Pneumococcal Vaccine 39-74 Years old (1 - PCV) Never done   HIV Screening  Never done   Hepatitis C Screening  Never done   Zoster Vaccines- Shingrix (1 of 2) Never done   COLONOSCOPY (Pts 45-49yrs Insurance coverage will need to be confirmed)  Never done   COVID-19 Vaccine (2 - Moderna risk series) 03/27/2020   PAP SMEAR-Modifier  03/21/2021    There are no preventive care reminders to display for this patient.  Lab Results  Component Value Date   TSH 3.18 01/13/2021   Lab Results  Component Value Date   WBC 4.9 10/27/2020   HGB 15.1 10/27/2020   HCT 44.4 10/27/2020   MCV 94.7 10/27/2020   PLT 273 10/27/2020   Lab Results  Component Value Date   NA 138 10/27/2020   K 4.1 10/27/2020   CO2 27 10/27/2020   GLUCOSE 83 10/27/2020   BUN 17 10/27/2020   CREATININE 1.05 10/27/2020   BILITOT 0.5 10/27/2020   ALKPHOS 51 01/31/2017   AST 23 10/27/2020   ALT 20 10/27/2020   PROT 6.8 10/27/2020   ALBUMIN 4.4 01/31/2017   CALCIUM 9.4 10/27/2020   Lab Results  Component Value Date   CHOL 211 (H) 10/27/2020   Lab Results  Component Value Date   HDL 78 10/27/2020   Lab Results  Component Value Date   LDLCALC 118 (H) 10/27/2020   Lab Results  Component Value Date   TRIG 62 10/27/2020   Lab Results  Component Value Date   CHOLHDL 2.7 10/27/2020   No results found for: HGBA1C    Assessment & Plan:   Here for preoperative evaluation.  Patient's surgeon is specifically requesting studies as above.  She is low risk with no history of any chronic cardiac or pulmonary problems  -Obtain EKG-she does have sinus bradycardia  likely related to her regular vigorous physical activity.  No other acute findings or concerns. -Obtain blood work including CBC, CMP, A1c, INR, PTT -No contraindications to surgery unless otherwise indicated by blood work above   No orders of the defined types were placed in this  encounter.   Follow-up: No follow-ups on file.    Carolann Littler, MD

## 2021-07-01 LAB — CBC WITH DIFFERENTIAL/PLATELET
Basophils Absolute: 0 10*3/uL (ref 0.0–0.1)
Basophils Relative: 0.4 % (ref 0.0–3.0)
Eosinophils Absolute: 0.1 10*3/uL (ref 0.0–0.7)
Eosinophils Relative: 0.8 % (ref 0.0–5.0)
HCT: 39.1 % (ref 36.0–46.0)
Hemoglobin: 13.2 g/dL (ref 12.0–15.0)
Lymphocytes Relative: 22.6 % (ref 12.0–46.0)
Lymphs Abs: 1.6 10*3/uL (ref 0.7–4.0)
MCHC: 33.7 g/dL (ref 30.0–36.0)
MCV: 95.6 fl (ref 78.0–100.0)
Monocytes Absolute: 0.3 10*3/uL (ref 0.1–1.0)
Monocytes Relative: 4.1 % (ref 3.0–12.0)
Neutro Abs: 5 10*3/uL (ref 1.4–7.7)
Neutrophils Relative %: 72.1 % (ref 43.0–77.0)
Platelets: 263 10*3/uL (ref 150.0–400.0)
RBC: 4.09 Mil/uL (ref 3.87–5.11)
RDW: 13.4 % (ref 11.5–15.5)
WBC: 6.9 10*3/uL (ref 4.0–10.5)

## 2021-07-12 DIAGNOSIS — M9902 Segmental and somatic dysfunction of thoracic region: Secondary | ICD-10-CM | POA: Diagnosis not present

## 2021-07-12 DIAGNOSIS — M9903 Segmental and somatic dysfunction of lumbar region: Secondary | ICD-10-CM | POA: Diagnosis not present

## 2021-07-12 DIAGNOSIS — M9905 Segmental and somatic dysfunction of pelvic region: Secondary | ICD-10-CM | POA: Diagnosis not present

## 2021-07-12 DIAGNOSIS — M2569 Stiffness of other specified joint, not elsewhere classified: Secondary | ICD-10-CM | POA: Diagnosis not present

## 2021-07-12 DIAGNOSIS — M9901 Segmental and somatic dysfunction of cervical region: Secondary | ICD-10-CM | POA: Diagnosis not present

## 2021-07-15 DIAGNOSIS — M9902 Segmental and somatic dysfunction of thoracic region: Secondary | ICD-10-CM | POA: Diagnosis not present

## 2021-07-15 DIAGNOSIS — M9901 Segmental and somatic dysfunction of cervical region: Secondary | ICD-10-CM | POA: Diagnosis not present

## 2021-07-15 DIAGNOSIS — M2569 Stiffness of other specified joint, not elsewhere classified: Secondary | ICD-10-CM | POA: Diagnosis not present

## 2021-07-15 DIAGNOSIS — M9903 Segmental and somatic dysfunction of lumbar region: Secondary | ICD-10-CM | POA: Diagnosis not present

## 2021-07-15 DIAGNOSIS — M9905 Segmental and somatic dysfunction of pelvic region: Secondary | ICD-10-CM | POA: Diagnosis not present

## 2021-07-19 DIAGNOSIS — M9902 Segmental and somatic dysfunction of thoracic region: Secondary | ICD-10-CM | POA: Diagnosis not present

## 2021-07-19 DIAGNOSIS — M9905 Segmental and somatic dysfunction of pelvic region: Secondary | ICD-10-CM | POA: Diagnosis not present

## 2021-07-19 DIAGNOSIS — M9901 Segmental and somatic dysfunction of cervical region: Secondary | ICD-10-CM | POA: Diagnosis not present

## 2021-07-19 DIAGNOSIS — M2569 Stiffness of other specified joint, not elsewhere classified: Secondary | ICD-10-CM | POA: Diagnosis not present

## 2021-07-19 DIAGNOSIS — M9903 Segmental and somatic dysfunction of lumbar region: Secondary | ICD-10-CM | POA: Diagnosis not present

## 2021-08-12 DIAGNOSIS — Z20822 Contact with and (suspected) exposure to covid-19: Secondary | ICD-10-CM | POA: Diagnosis not present

## 2021-09-30 DIAGNOSIS — Z131 Encounter for screening for diabetes mellitus: Secondary | ICD-10-CM | POA: Diagnosis not present

## 2021-09-30 DIAGNOSIS — Z13228 Encounter for screening for other metabolic disorders: Secondary | ICD-10-CM | POA: Diagnosis not present

## 2021-09-30 DIAGNOSIS — R634 Abnormal weight loss: Secondary | ICD-10-CM | POA: Diagnosis not present

## 2021-10-06 DIAGNOSIS — Z191 Hormone sensitive malignancy status: Secondary | ICD-10-CM | POA: Diagnosis not present

## 2021-10-06 DIAGNOSIS — Z0000 Encounter for general adult medical examination without abnormal findings: Secondary | ICD-10-CM | POA: Diagnosis not present

## 2021-10-06 DIAGNOSIS — E039 Hypothyroidism, unspecified: Secondary | ICD-10-CM | POA: Diagnosis not present

## 2021-11-18 DIAGNOSIS — K5904 Chronic idiopathic constipation: Secondary | ICD-10-CM | POA: Diagnosis not present

## 2021-11-18 DIAGNOSIS — Z1211 Encounter for screening for malignant neoplasm of colon: Secondary | ICD-10-CM | POA: Diagnosis not present

## 2021-12-07 DIAGNOSIS — M9901 Segmental and somatic dysfunction of cervical region: Secondary | ICD-10-CM | POA: Diagnosis not present

## 2021-12-07 DIAGNOSIS — M9903 Segmental and somatic dysfunction of lumbar region: Secondary | ICD-10-CM | POA: Diagnosis not present

## 2021-12-07 DIAGNOSIS — M531 Cervicobrachial syndrome: Secondary | ICD-10-CM | POA: Diagnosis not present

## 2021-12-07 DIAGNOSIS — M9902 Segmental and somatic dysfunction of thoracic region: Secondary | ICD-10-CM | POA: Diagnosis not present

## 2021-12-10 DIAGNOSIS — M9902 Segmental and somatic dysfunction of thoracic region: Secondary | ICD-10-CM | POA: Diagnosis not present

## 2021-12-10 DIAGNOSIS — M531 Cervicobrachial syndrome: Secondary | ICD-10-CM | POA: Diagnosis not present

## 2021-12-10 DIAGNOSIS — M9903 Segmental and somatic dysfunction of lumbar region: Secondary | ICD-10-CM | POA: Diagnosis not present

## 2021-12-10 DIAGNOSIS — M9901 Segmental and somatic dysfunction of cervical region: Secondary | ICD-10-CM | POA: Diagnosis not present

## 2021-12-14 DIAGNOSIS — M9903 Segmental and somatic dysfunction of lumbar region: Secondary | ICD-10-CM | POA: Diagnosis not present

## 2021-12-14 DIAGNOSIS — M9901 Segmental and somatic dysfunction of cervical region: Secondary | ICD-10-CM | POA: Diagnosis not present

## 2021-12-14 DIAGNOSIS — M531 Cervicobrachial syndrome: Secondary | ICD-10-CM | POA: Diagnosis not present

## 2021-12-14 DIAGNOSIS — M9902 Segmental and somatic dysfunction of thoracic region: Secondary | ICD-10-CM | POA: Diagnosis not present

## 2021-12-16 DIAGNOSIS — M531 Cervicobrachial syndrome: Secondary | ICD-10-CM | POA: Diagnosis not present

## 2021-12-16 DIAGNOSIS — M9901 Segmental and somatic dysfunction of cervical region: Secondary | ICD-10-CM | POA: Diagnosis not present

## 2021-12-16 DIAGNOSIS — M9902 Segmental and somatic dysfunction of thoracic region: Secondary | ICD-10-CM | POA: Diagnosis not present

## 2021-12-16 DIAGNOSIS — M9903 Segmental and somatic dysfunction of lumbar region: Secondary | ICD-10-CM | POA: Diagnosis not present

## 2021-12-21 DIAGNOSIS — M9902 Segmental and somatic dysfunction of thoracic region: Secondary | ICD-10-CM | POA: Diagnosis not present

## 2021-12-21 DIAGNOSIS — M9901 Segmental and somatic dysfunction of cervical region: Secondary | ICD-10-CM | POA: Diagnosis not present

## 2021-12-21 DIAGNOSIS — M9903 Segmental and somatic dysfunction of lumbar region: Secondary | ICD-10-CM | POA: Diagnosis not present

## 2021-12-21 DIAGNOSIS — M531 Cervicobrachial syndrome: Secondary | ICD-10-CM | POA: Diagnosis not present

## 2021-12-23 DIAGNOSIS — Z780 Asymptomatic menopausal state: Secondary | ICD-10-CM | POA: Diagnosis not present

## 2021-12-23 DIAGNOSIS — M9903 Segmental and somatic dysfunction of lumbar region: Secondary | ICD-10-CM | POA: Diagnosis not present

## 2021-12-23 DIAGNOSIS — M531 Cervicobrachial syndrome: Secondary | ICD-10-CM | POA: Diagnosis not present

## 2021-12-23 DIAGNOSIS — Z01419 Encounter for gynecological examination (general) (routine) without abnormal findings: Secondary | ICD-10-CM | POA: Diagnosis not present

## 2021-12-23 DIAGNOSIS — N951 Menopausal and female climacteric states: Secondary | ICD-10-CM | POA: Diagnosis not present

## 2021-12-23 DIAGNOSIS — Z130 Encounter for screening for diseases of the blood and blood-forming organs and certain disorders involving the immune mechanism: Secondary | ICD-10-CM | POA: Diagnosis not present

## 2021-12-23 DIAGNOSIS — M9902 Segmental and somatic dysfunction of thoracic region: Secondary | ICD-10-CM | POA: Diagnosis not present

## 2021-12-23 DIAGNOSIS — M9901 Segmental and somatic dysfunction of cervical region: Secondary | ICD-10-CM | POA: Diagnosis not present

## 2021-12-28 DIAGNOSIS — M9903 Segmental and somatic dysfunction of lumbar region: Secondary | ICD-10-CM | POA: Diagnosis not present

## 2021-12-28 DIAGNOSIS — M531 Cervicobrachial syndrome: Secondary | ICD-10-CM | POA: Diagnosis not present

## 2021-12-28 DIAGNOSIS — M9902 Segmental and somatic dysfunction of thoracic region: Secondary | ICD-10-CM | POA: Diagnosis not present

## 2021-12-28 DIAGNOSIS — M9901 Segmental and somatic dysfunction of cervical region: Secondary | ICD-10-CM | POA: Diagnosis not present

## 2022-01-06 DIAGNOSIS — F419 Anxiety disorder, unspecified: Secondary | ICD-10-CM | POA: Diagnosis not present

## 2022-01-06 DIAGNOSIS — R41840 Attention and concentration deficit: Secondary | ICD-10-CM | POA: Diagnosis not present

## 2022-02-11 DIAGNOSIS — K5730 Diverticulosis of large intestine without perforation or abscess without bleeding: Secondary | ICD-10-CM | POA: Diagnosis not present

## 2022-02-11 DIAGNOSIS — Z1211 Encounter for screening for malignant neoplasm of colon: Secondary | ICD-10-CM | POA: Diagnosis not present

## 2022-03-03 ENCOUNTER — Other Ambulatory Visit: Payer: Self-pay | Admitting: Gynecology

## 2022-03-04 ENCOUNTER — Other Ambulatory Visit: Payer: Self-pay | Admitting: Gynecology

## 2022-03-04 DIAGNOSIS — N6012 Diffuse cystic mastopathy of left breast: Secondary | ICD-10-CM

## 2022-03-04 DIAGNOSIS — N6011 Diffuse cystic mastopathy of right breast: Secondary | ICD-10-CM

## 2022-03-16 ENCOUNTER — Ambulatory Visit
Admission: RE | Admit: 2022-03-16 | Discharge: 2022-03-16 | Disposition: A | Discharge status: Home / Self Care | Payer: BC Managed Care – PPO | Source: Ambulatory Visit | Attending: Gynecology | Admitting: Gynecology

## 2022-03-16 DIAGNOSIS — Z803 Family history of malignant neoplasm of breast: Secondary | ICD-10-CM | POA: Diagnosis not present

## 2022-03-16 DIAGNOSIS — N6011 Diffuse cystic mastopathy of right breast: Secondary | ICD-10-CM

## 2022-03-16 DIAGNOSIS — N6012 Diffuse cystic mastopathy of left breast: Secondary | ICD-10-CM

## 2022-03-16 MED ORDER — GADOBUTROL 1 MMOL/ML IV SOLN
6.0000 mL | Freq: Once | INTRAVENOUS | Status: AC | PRN
  Administered 2022-03-16: 6 mL via INTRAVENOUS

## 2022-03-31 DIAGNOSIS — M5412 Radiculopathy, cervical region: Secondary | ICD-10-CM | POA: Diagnosis not present

## 2022-04-07 DIAGNOSIS — F902 Attention-deficit hyperactivity disorder, combined type: Secondary | ICD-10-CM | POA: Diagnosis not present

## 2022-04-07 DIAGNOSIS — Z79899 Other long term (current) drug therapy: Secondary | ICD-10-CM | POA: Diagnosis not present

## 2022-04-08 DIAGNOSIS — M542 Cervicalgia: Secondary | ICD-10-CM | POA: Diagnosis not present

## 2022-04-13 DIAGNOSIS — M5030 Other cervical disc degeneration, unspecified cervical region: Secondary | ICD-10-CM | POA: Diagnosis not present

## 2022-04-13 DIAGNOSIS — M47812 Spondylosis without myelopathy or radiculopathy, cervical region: Secondary | ICD-10-CM | POA: Diagnosis not present

## 2022-04-22 DIAGNOSIS — M5412 Radiculopathy, cervical region: Secondary | ICD-10-CM | POA: Diagnosis not present

## 2022-04-28 IMAGING — MR MR BREAST WO/W CM  BILAT
5 series · 30 of 48 positions shown · IV contrast (gadavist)
Comparison: Bilateral breast MRI dated 01/21/2021 and bilateral
screening mammogram dated 10/15/2020.

CLINICAL DATA: Abbreviated Breast MRI for breast cancer screening.
Family history of breast cancer and her mother at age 71 and
maternal grandmother in her 70s. Previous estrogen use for 4 years.

EXAM:
BILATERAL ABBREVIATED BREAST MRI WITH AND WITHOUT CONTRAST
TECHNIQUE: Multiplanar, multisequence MR images of both breasts were obtained
prior to and following the intravenous administration of 6 ml of
Gadavist

[Series 2: t2_tirm_tra ipat (a-p) · axial · 3.0mm · 0.66mm/px · z∈[-67,+95]mm · 5 of 55 slices shown]
[im 1/55]
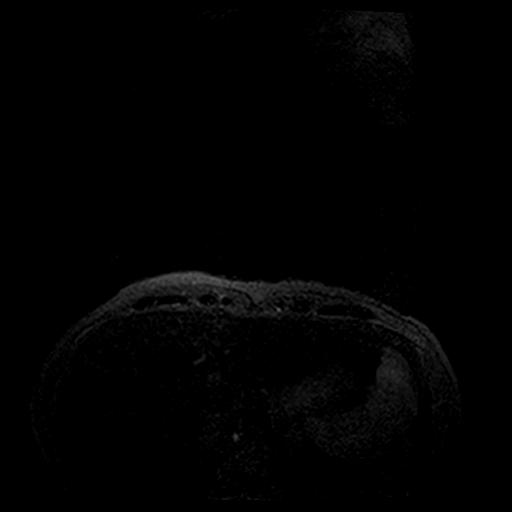
[im 14/55]
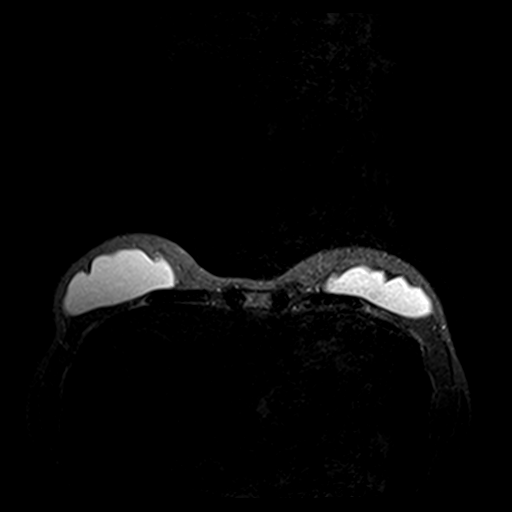
[im 28/55]
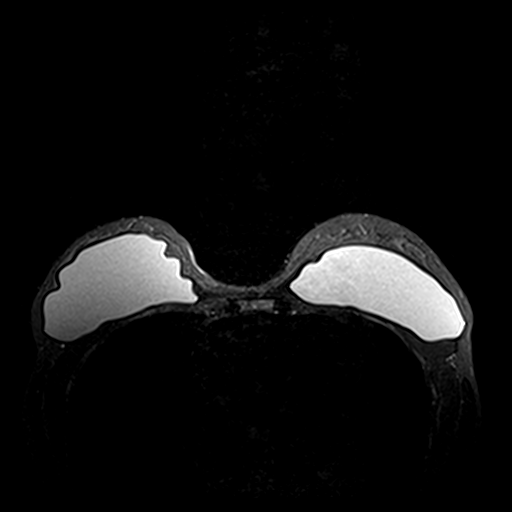
[im 41/55]
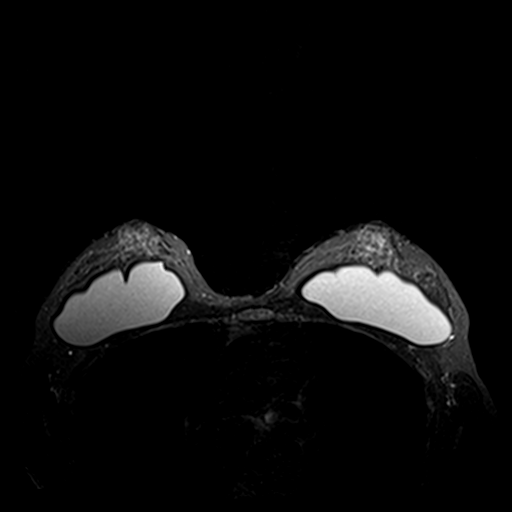
[im 55/55]
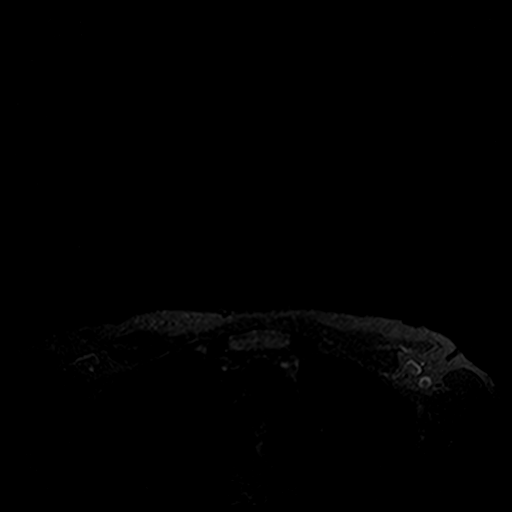

[Series 3: fl3d pre-cm · axial · non-contrast · 1.2mm · 0.89mm/px · z∈[-72,+100]mm · 8 of 144 slices shown]
[im 1/144]
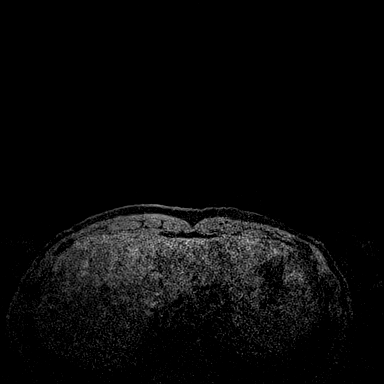
[im 23/144]
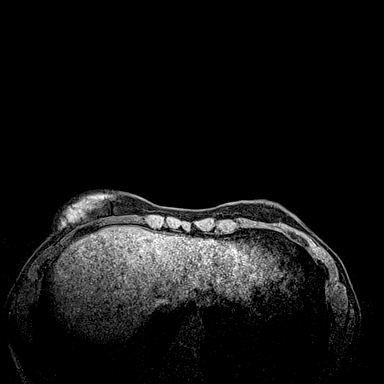
[im 45/144]
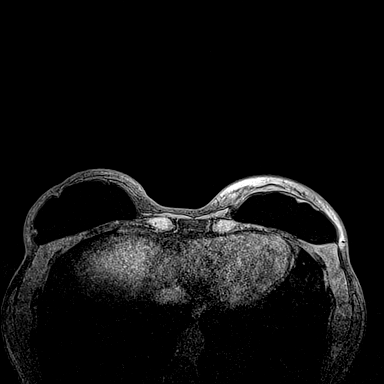
[im 67/144]
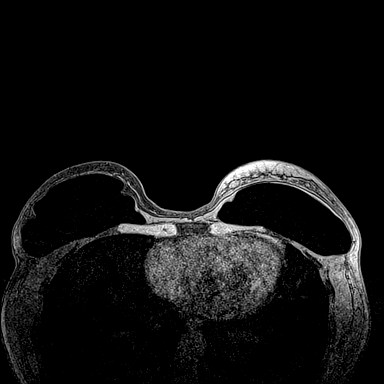
[im 78/144]
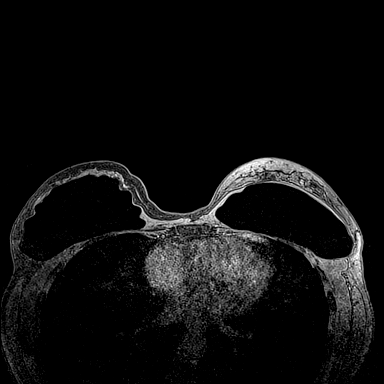
[im 100/144]
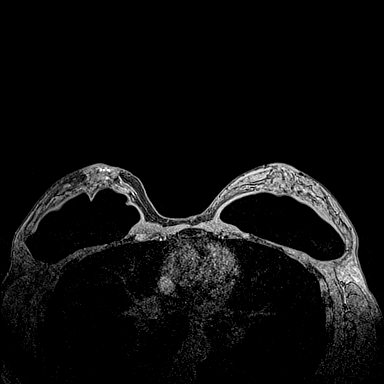
[im 122/144]
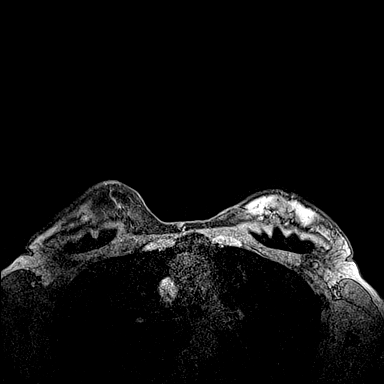
[im 144/144]
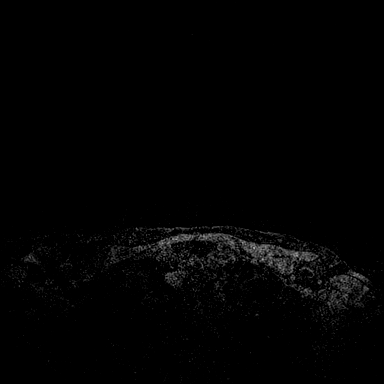

[Series 4: fl3d post-cm 20 · axial · 1.2mm · 0.89mm/px · z∈[-72,+100]mm · 8 of 144 slices shown (1 of 3)]
[im 1/144]
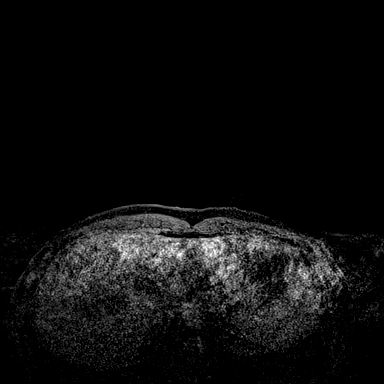
[im 23/144]
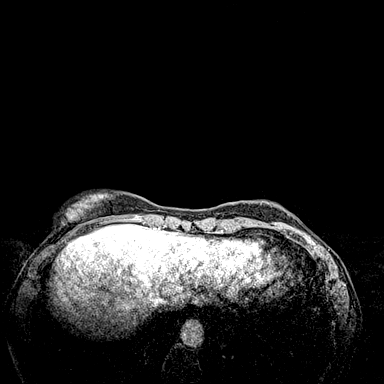
[im 45/144]
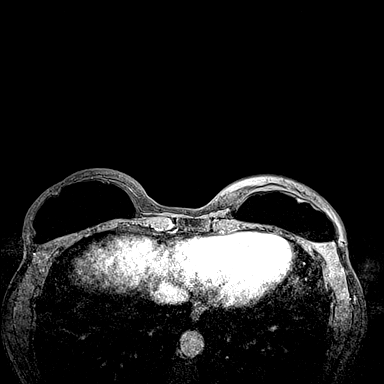
[im 67/144]
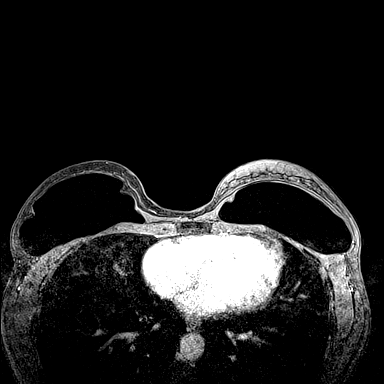
[im 78/144]
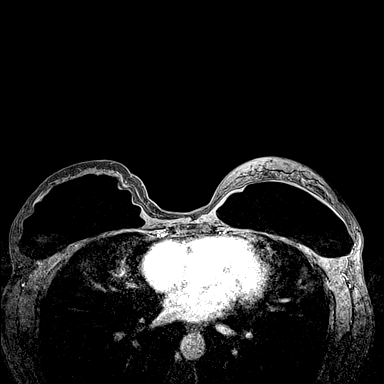
[im 100/144]
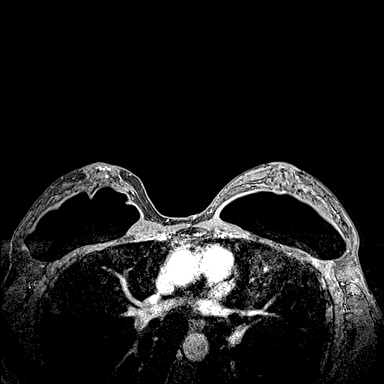
[im 122/144]
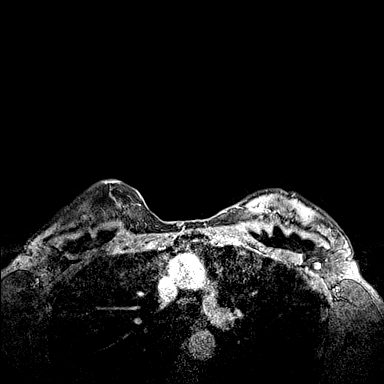
[im 144/144]
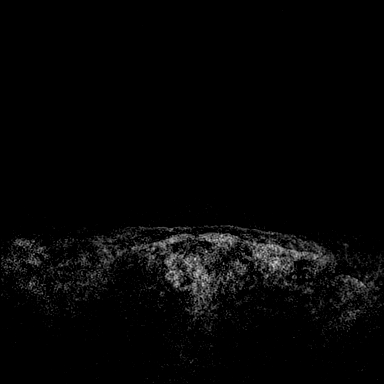

[Series 5: fl3d post-cm 20 · axial · 1.2mm · 0.89mm/px · z∈[-72,+100]mm · 8 of 144 slices shown (2 of 3)]
[im 1/144]
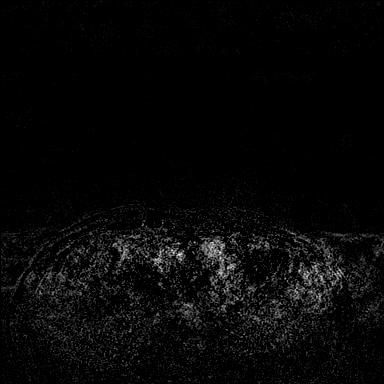
[im 23/144]
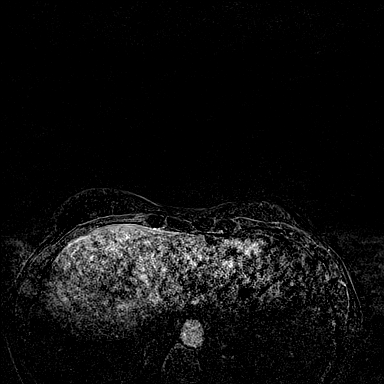
[im 45/144]
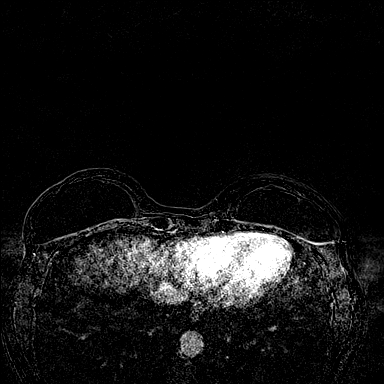
[im 67/144]
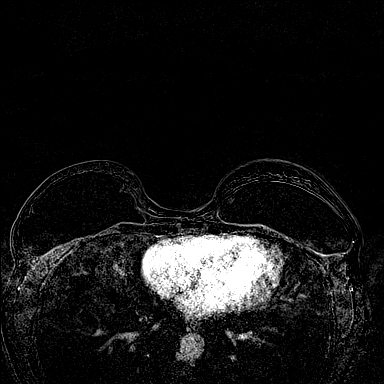
[im 78/144]
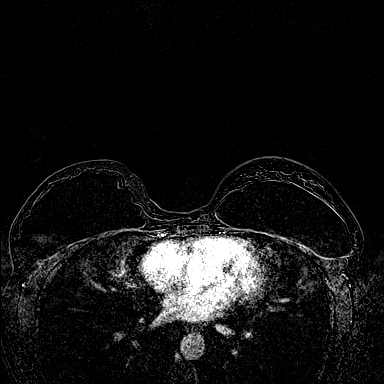
[im 100/144]
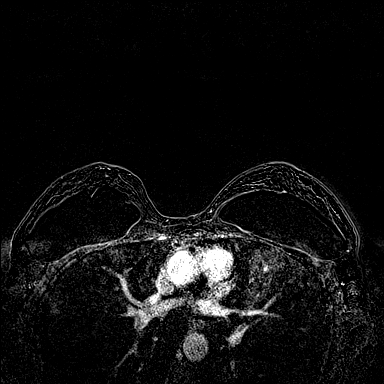
[im 122/144]
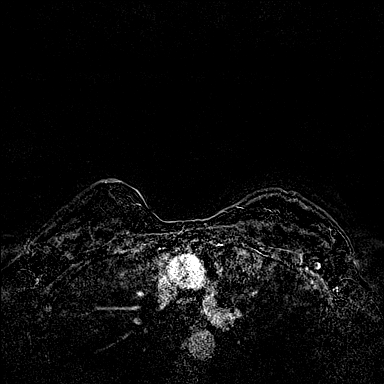
[im 144/144]
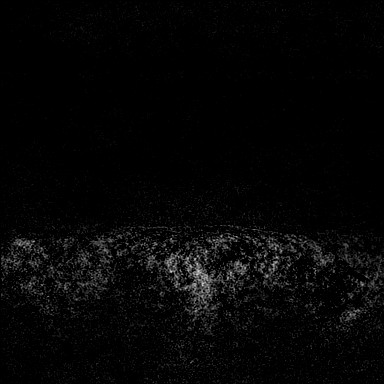

[Series 6: fl3d post-cm 20 · axial · 172.8mm · 0.89mm/px · 1 of 1 slices shown (3 of 3)]
[im 1/1]
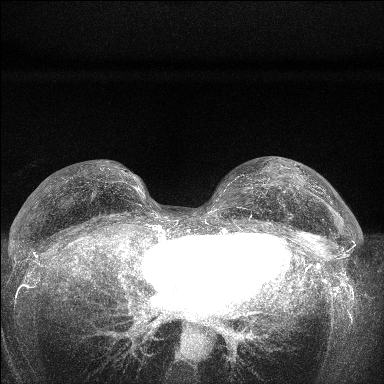

[30 of 48 positions shown; findings below may reference images not displayed]

Three-dimensional MR images were rendered by post-processing of the
original MR data on an independent workstation. The
three-dimensional MR images were interpreted, and findings are
reported in the following complete MRI report for this study. Three
dimensional images were evaluated at the independent [employer]
workstation
FINDINGS: Breast composition: d. Extreme fibroglandular tissue.

Background parenchymal enhancement: Minimal

Right breast: Intact retropectoral implant. No mass or abnormal
enhancement.

Left breast: Intact retropectoral implant. No mass or abnormal
enhancement.

Lymph nodes: No abnormal appearing lymph nodes.

Ancillary findings:  None.
IMPRESSION: No evidence of malignancy.

RECOMMENDATION:
Recommend annual screening mammography, due now. The patient is also
eligible for annual Abbreviated Breast MRI if she wishes.

BI-RADS CATEGORY  1: Negative.

## 2022-05-12 DIAGNOSIS — M47812 Spondylosis without myelopathy or radiculopathy, cervical region: Secondary | ICD-10-CM | POA: Diagnosis not present

## 2022-05-23 DIAGNOSIS — M47812 Spondylosis without myelopathy or radiculopathy, cervical region: Secondary | ICD-10-CM | POA: Diagnosis not present

## 2022-06-15 DIAGNOSIS — L84 Corns and callosities: Secondary | ICD-10-CM | POA: Diagnosis not present

## 2022-06-15 DIAGNOSIS — L814 Other melanin hyperpigmentation: Secondary | ICD-10-CM | POA: Diagnosis not present

## 2022-06-15 DIAGNOSIS — D225 Melanocytic nevi of trunk: Secondary | ICD-10-CM | POA: Diagnosis not present

## 2022-06-15 DIAGNOSIS — D2261 Melanocytic nevi of right upper limb, including shoulder: Secondary | ICD-10-CM | POA: Diagnosis not present

## 2022-06-22 DIAGNOSIS — M47812 Spondylosis without myelopathy or radiculopathy, cervical region: Secondary | ICD-10-CM | POA: Diagnosis not present

## 2022-06-29 DIAGNOSIS — M4722 Other spondylosis with radiculopathy, cervical region: Secondary | ICD-10-CM | POA: Diagnosis not present

## 2022-07-04 DIAGNOSIS — M4722 Other spondylosis with radiculopathy, cervical region: Secondary | ICD-10-CM | POA: Diagnosis not present

## 2022-07-05 DIAGNOSIS — M542 Cervicalgia: Secondary | ICD-10-CM | POA: Diagnosis not present

## 2022-07-07 DIAGNOSIS — F902 Attention-deficit hyperactivity disorder, combined type: Secondary | ICD-10-CM | POA: Diagnosis not present

## 2022-07-07 DIAGNOSIS — M4722 Other spondylosis with radiculopathy, cervical region: Secondary | ICD-10-CM | POA: Diagnosis not present

## 2022-07-07 DIAGNOSIS — Z79899 Other long term (current) drug therapy: Secondary | ICD-10-CM | POA: Diagnosis not present

## 2022-07-12 DIAGNOSIS — M4722 Other spondylosis with radiculopathy, cervical region: Secondary | ICD-10-CM | POA: Diagnosis not present

## 2022-07-14 DIAGNOSIS — M4722 Other spondylosis with radiculopathy, cervical region: Secondary | ICD-10-CM | POA: Diagnosis not present

## 2022-07-25 DIAGNOSIS — M4722 Other spondylosis with radiculopathy, cervical region: Secondary | ICD-10-CM | POA: Diagnosis not present

## 2022-08-09 DIAGNOSIS — M4722 Other spondylosis with radiculopathy, cervical region: Secondary | ICD-10-CM | POA: Diagnosis not present

## 2022-08-19 DIAGNOSIS — M4722 Other spondylosis with radiculopathy, cervical region: Secondary | ICD-10-CM | POA: Diagnosis not present

## 2022-08-23 DIAGNOSIS — M4722 Other spondylosis with radiculopathy, cervical region: Secondary | ICD-10-CM | POA: Diagnosis not present

## 2022-08-31 ENCOUNTER — Encounter: Payer: BC Managed Care – PPO | Admitting: Family Medicine

## 2022-09-02 DIAGNOSIS — M4722 Other spondylosis with radiculopathy, cervical region: Secondary | ICD-10-CM | POA: Diagnosis not present

## 2022-09-05 ENCOUNTER — Encounter: Payer: BC Managed Care – PPO | Admitting: Family Medicine

## 2022-09-07 DIAGNOSIS — M4722 Other spondylosis with radiculopathy, cervical region: Secondary | ICD-10-CM | POA: Diagnosis not present

## 2022-09-14 ENCOUNTER — Encounter: Payer: Self-pay | Admitting: Family Medicine

## 2022-09-14 ENCOUNTER — Ambulatory Visit (INDEPENDENT_AMBULATORY_CARE_PROVIDER_SITE_OTHER): Payer: BC Managed Care – PPO | Admitting: Family Medicine

## 2022-09-14 VITALS — BP 116/68 | HR 64 | Temp 97.5°F | Ht 62.99 in | Wt 122.6 lb

## 2022-09-14 DIAGNOSIS — Z8249 Family history of ischemic heart disease and other diseases of the circulatory system: Secondary | ICD-10-CM

## 2022-09-14 DIAGNOSIS — Z23 Encounter for immunization: Secondary | ICD-10-CM

## 2022-09-14 DIAGNOSIS — Z0000 Encounter for general adult medical examination without abnormal findings: Secondary | ICD-10-CM | POA: Diagnosis not present

## 2022-09-14 DIAGNOSIS — M4722 Other spondylosis with radiculopathy, cervical region: Secondary | ICD-10-CM | POA: Diagnosis not present

## 2022-09-14 DIAGNOSIS — Z1159 Encounter for screening for other viral diseases: Secondary | ICD-10-CM | POA: Diagnosis not present

## 2022-09-14 LAB — CBC WITH DIFFERENTIAL/PLATELET
Basophils Absolute: 0 10*3/uL (ref 0.0–0.1)
Basophils Relative: 1 % (ref 0.0–3.0)
Eosinophils Absolute: 0.2 10*3/uL (ref 0.0–0.7)
Eosinophils Relative: 3.9 % (ref 0.0–5.0)
HCT: 43.6 % (ref 36.0–46.0)
Hemoglobin: 14.9 g/dL (ref 12.0–15.0)
Lymphocytes Relative: 31.6 % (ref 12.0–46.0)
Lymphs Abs: 1.5 10*3/uL (ref 0.7–4.0)
MCHC: 34.1 g/dL (ref 30.0–36.0)
MCV: 94.9 fl (ref 78.0–100.0)
Monocytes Absolute: 0.4 10*3/uL (ref 0.1–1.0)
Monocytes Relative: 8.2 % (ref 3.0–12.0)
Neutro Abs: 2.6 10*3/uL (ref 1.4–7.7)
Neutrophils Relative %: 55.3 % (ref 43.0–77.0)
Platelets: 263 10*3/uL (ref 150.0–400.0)
RBC: 4.6 Mil/uL (ref 3.87–5.11)
RDW: 13.5 % (ref 11.5–15.5)
WBC: 4.6 10*3/uL (ref 4.0–10.5)

## 2022-09-14 LAB — LIPID PANEL
Cholesterol: 208 mg/dL — ABNORMAL HIGH (ref 0–200)
HDL: 83.8 mg/dL (ref >39.00)
LDL Cholesterol: 116 mg/dL — ABNORMAL HIGH (ref 0–99)
NonHDL: 124.36
Total CHOL/HDL Ratio: 2
Triglycerides: 43 mg/dL (ref 0.0–149.0)
VLDL: 8.6 mg/dL (ref 0.0–40.0)

## 2022-09-14 LAB — HEPATIC FUNCTION PANEL
ALT: 19 U/L (ref 0–35)
AST: 27 U/L (ref 0–37)
Albumin: 4.9 g/dL (ref 3.5–5.2)
Alkaline Phosphatase: 51 U/L (ref 39–117)
Bilirubin, Direct: 0.1 mg/dL (ref 0.0–0.3)
Total Bilirubin: 0.7 mg/dL (ref 0.2–1.2)
Total Protein: 7.5 g/dL (ref 6.0–8.3)

## 2022-09-14 LAB — TSH: TSH: 5.39 u[IU]/mL (ref 0.35–5.50)

## 2022-09-14 LAB — BASIC METABOLIC PANEL
BUN: 18 mg/dL (ref 6–23)
CO2: 28 mEq/L (ref 19–32)
Calcium: 9.7 mg/dL (ref 8.4–10.5)
Chloride: 101 mEq/L (ref 96–112)
Creatinine, Ser: 1.14 mg/dL (ref 0.40–1.20)
GFR: 55.21 mL/min — ABNORMAL LOW (ref >60.00)
Glucose, Bld: 80 mg/dL (ref 70–99)
Potassium: 4.7 mEq/L (ref 3.5–5.1)
Sodium: 137 mEq/L (ref 135–145)

## 2022-09-14 MED ORDER — TRAZODONE HCL 50 MG PO TABS: 25.0000 mg | ORAL_TABLET | Freq: Every evening | ORAL | 1 refills | Status: DC | PRN

## 2022-09-14 NOTE — Progress Notes (Unsigned)
Established Patient Office Visit  Subjective   Patient ID: Alicia Myers, female    DOB: May 07, 1971  Age: 51 y.o. MRN: [id number]  Chief Complaint  Patient presents with   Annual Exam    HPI  {History (Optional):23778} Alicia Myers is seen for physical exam.  Generally very healthy.  She has history of hypothyroidism and is on replacement.  Exercises regularly.  Very health-conscious regarding diet.  She does have concerns regarding CAD risk.  She had a paternal grandfather that had coronary disease at age 19.  Her father has type 2 diabetes but no history of known CAD.  Health maintenance reviewed  -She sees gynecologist yearly for Pap smears and mammogram these are up-to-date -Needs flu vaccine -Need Tdap -We will consider Shingrix later -No history of hepatitis C screening but low risk -Colonoscopy due 2033  Past Medical History:  Diagnosis Date   Biceps tendon tear    Thyroid cancer ([place])    PAPILLARY THYROID CA --THYROIDECTOMY   Past Surgical History:  Procedure Laterality Date   AUGMENTATION MAMMAPLASTY  2007   THYROIDECTOMY     PAPILLARY THYROID CANCER   TONSILLECTOMY      reports that she has never smoked. She has never used smokeless tobacco. She reports current alcohol use of about 2.0 standard drinks of alcohol per week. She reports that she does not use drugs. family history includes Breast cancer (age of onset: 44) in her paternal aunt; Diabetes in her father and mother; Heart disease (age of onset: 7) in her paternal grandfather. Allergies  Allergen Reactions   Sulfa Antibiotics Hives and Other (See Comments)    Passed out      Review of Systems  Constitutional:  Negative for chills, fever, malaise/fatigue and weight loss.  HENT:  Negative for hearing loss.   Eyes:  Negative for blurred vision and double vision.  Respiratory:  Negative for cough and shortness of breath.   Cardiovascular:  Negative for chest pain, palpitations and leg swelling.   Gastrointestinal:  Negative for abdominal pain, blood in stool, constipation and diarrhea.  Genitourinary:  Negative for dysuria.  Skin:  Negative for rash.  Neurological:  Negative for dizziness, speech change, seizures, loss of consciousness and headaches.  Psychiatric/Behavioral:  Negative for depression.       Objective:     BP 116/68 (BP Location: Left Arm, Patient Position: Sitting, Cuff Size: Normal)   Pulse 64   Temp (!) 97.5 F (36.4 C) (Oral)   Ht 5' 2.99" (1.6 m)   Wt 122 lb 9.6 oz (55.6 kg)   LMP 09/18/2016   SpO2 98%   BMI 21.72 kg/m  {Vitals History (Optional):23777}  Physical Exam Vitals reviewed.  Constitutional:      Appearance: She is well-developed.  HENT:     Head: Normocephalic and atraumatic.  Eyes:     Pupils: Pupils are equal, round, and reactive to light.  Neck:     Thyroid: No thyromegaly.  Cardiovascular:     Rate and Rhythm: Normal rate and regular rhythm.     Heart sounds: Normal heart sounds. No murmur heard. Pulmonary:     Effort: No respiratory distress.     Breath sounds: Normal breath sounds. No wheezing or rales.  Abdominal:     General: Bowel sounds are normal. There is no distension.     Palpations: Abdomen is soft. There is no mass.     Tenderness: There is no abdominal tenderness. There is no guarding or rebound.  Musculoskeletal:        General: Normal range of motion.     Cervical back: Normal range of motion and neck supple.     Right lower leg: No edema.     Left lower leg: No edema.  Lymphadenopathy:     Cervical: No cervical adenopathy.  Skin:    Findings: No rash.  Neurological:     Mental Status: She is alert and oriented to person, place, and time.     Cranial Nerves: No cranial nerve deficit.     Deep Tendon Reflexes: Reflexes normal.  Psychiatric:        Behavior: Behavior normal.        Thought Content: Thought content normal.        Judgment: Judgment normal.      No results found for any visits on  09/14/22.  {Labs (Optional):23779}  The 10-year ASCVD risk score (Arnett DK, et al., 2019) is: 0.8%    Assessment & Plan:   Problem List Items Addressed This Visit   None Visit Diagnoses     Physical exam    -  Primary   Relevant Orders   Basic metabolic panel   Lipid panel   CBC with Differential/Platelet   TSH   Hepatic function panel   Hep C Antibody   Family history of premature CAD       Relevant Orders   CT CARDIAC SCORING (SELF PAY ONLY)     -Generally healthy 51 year old female. -She is concerned about risk for CAD though her calculated 10-year risk is only 0.8%.  She had a paternal grandfather with CAD age 54.  Discussed pros and cons of coronary calcium score and she would like to proceed -Refill levothyroxine for 1 year -Obtain screening labs as above -Flu vaccine and Tdap given -She will consider Shingrix later this year -She plans to continue GYN follow-up for Pap smears and mammograms  No follow-ups on file.    Alicia Littler, MD

## 2022-09-14 NOTE — Patient Instructions (Signed)
I will be setting up coronary calcium scan.

## 2022-09-15 LAB — HEPATITIS C ANTIBODY: Hepatitis C Ab: NONREACTIVE

## 2022-10-06 ENCOUNTER — Other Ambulatory Visit: Payer: Self-pay | Admitting: Family Medicine

## 2022-10-14 ENCOUNTER — Ambulatory Visit (HOSPITAL_BASED_OUTPATIENT_CLINIC_OR_DEPARTMENT_OTHER)
Admission: RE | Admit: 2022-10-14 | Discharge: 2022-10-14 | Disposition: A | Discharge status: Home / Self Care | Payer: BC Managed Care – PPO | Source: Ambulatory Visit | Attending: Family Medicine | Admitting: Family Medicine

## 2022-10-14 DIAGNOSIS — Z8249 Family history of ischemic heart disease and other diseases of the circulatory system: Secondary | ICD-10-CM | POA: Insufficient documentation

## 2022-11-03 DIAGNOSIS — E890 Postprocedural hypothyroidism: Secondary | ICD-10-CM | POA: Diagnosis not present

## 2022-11-03 DIAGNOSIS — N951 Menopausal and female climacteric states: Secondary | ICD-10-CM | POA: Diagnosis not present

## 2022-11-07 ENCOUNTER — Other Ambulatory Visit: Payer: Self-pay | Admitting: Family Medicine

## 2022-11-07 DIAGNOSIS — R232 Flushing: Secondary | ICD-10-CM | POA: Diagnosis not present

## 2022-11-07 DIAGNOSIS — N951 Menopausal and female climacteric states: Secondary | ICD-10-CM | POA: Diagnosis not present

## 2022-11-07 DIAGNOSIS — Z79890 Hormone replacement therapy (postmenopausal): Secondary | ICD-10-CM | POA: Diagnosis not present

## 2022-11-07 DIAGNOSIS — G479 Sleep disorder, unspecified: Secondary | ICD-10-CM | POA: Diagnosis not present

## 2022-11-07 NOTE — Telephone Encounter (Addendum)
Last refill-09/14/22--30 tabs, 1 refill Last OV CPE-09/14/22  No future OV scheduled.

## 2022-11-08 DIAGNOSIS — Z79899 Other long term (current) drug therapy: Secondary | ICD-10-CM | POA: Diagnosis not present

## 2022-11-08 DIAGNOSIS — F902 Attention-deficit hyperactivity disorder, combined type: Secondary | ICD-10-CM | POA: Diagnosis not present

## 2022-11-17 DIAGNOSIS — F902 Attention-deficit hyperactivity disorder, combined type: Secondary | ICD-10-CM | POA: Diagnosis not present

## 2022-12-06 DIAGNOSIS — E890 Postprocedural hypothyroidism: Secondary | ICD-10-CM | POA: Diagnosis not present

## 2022-12-06 DIAGNOSIS — N951 Menopausal and female climacteric states: Secondary | ICD-10-CM | POA: Diagnosis not present

## 2023-06-10 ENCOUNTER — Other Ambulatory Visit: Payer: Self-pay | Admitting: Family Medicine

## 2023-07-20 ENCOUNTER — Encounter: Payer: Self-pay | Admitting: Family Medicine

## 2023-07-24 ENCOUNTER — Ambulatory Visit (INDEPENDENT_AMBULATORY_CARE_PROVIDER_SITE_OTHER): Payer: BC Managed Care – PPO | Admitting: Family Medicine

## 2023-07-24 ENCOUNTER — Encounter: Payer: Self-pay | Admitting: Family Medicine

## 2023-07-24 VITALS — BP 110/70 | HR 65 | Ht 62.99 in | Wt 113.6 lb

## 2023-07-24 DIAGNOSIS — E785 Hyperlipidemia, unspecified: Secondary | ICD-10-CM

## 2023-07-24 DIAGNOSIS — H9313 Tinnitus, bilateral: Secondary | ICD-10-CM

## 2023-07-24 DIAGNOSIS — I499 Cardiac arrhythmia, unspecified: Secondary | ICD-10-CM | POA: Diagnosis not present

## 2023-07-24 DIAGNOSIS — E039 Hypothyroidism, unspecified: Secondary | ICD-10-CM

## 2023-07-24 LAB — LIPID PANEL
Cholesterol: 209 mg/dL — ABNORMAL HIGH (ref 0–200)
HDL: 77.4 mg/dL (ref >39.00)
LDL Cholesterol: 119 mg/dL — ABNORMAL HIGH (ref 0–99)
NonHDL: 131.2
Total CHOL/HDL Ratio: 3
Triglycerides: 61 mg/dL (ref 0.0–149.0)
VLDL: 12.2 mg/dL (ref 0.0–40.0)

## 2023-07-24 LAB — BASIC METABOLIC PANEL
BUN: 20 mg/dL (ref 6–23)
CO2: 28 mEq/L (ref 19–32)
Calcium: 9.6 mg/dL (ref 8.4–10.5)
Chloride: 102 mEq/L (ref 96–112)
Creatinine, Ser: 1.1 mg/dL (ref 0.40–1.20)
GFR: 57.28 mL/min — ABNORMAL LOW (ref >60.00)
Glucose, Bld: 100 mg/dL — ABNORMAL HIGH (ref 70–99)
Potassium: 4.6 mEq/L (ref 3.5–5.1)
Sodium: 139 mEq/L (ref 135–145)

## 2023-07-24 LAB — HEPATIC FUNCTION PANEL
ALT: 23 U/L (ref 0–35)
AST: 26 U/L (ref 0–37)
Albumin: 4.7 g/dL (ref 3.5–5.2)
Alkaline Phosphatase: 56 U/L (ref 39–117)
Bilirubin, Direct: 0.1 mg/dL (ref 0.0–0.3)
Total Bilirubin: 0.6 mg/dL (ref 0.2–1.2)
Total Protein: 7.2 g/dL (ref 6.0–8.3)

## 2023-07-24 LAB — TSH: TSH: 0.82 u[IU]/mL (ref 0.35–5.50)

## 2023-07-24 NOTE — Progress Notes (Signed)
Established Patient Office Visit  Subjective   Patient ID: Alicia Myers, female    DOB: 04-07-1971  Age: 52 y.o. MRN: [id number]  No chief complaint on file.   HPI   Alicia Myers is here to discuss recent health screening she had done when she was out in [place].  This was called a "preventative full body ultrasound health screening".  This entailed echocardiogram of the heart, carotid artery screening, thyroid screening, abdominal screening, pelvic screening, transcranial Doppler screening, breast screening, leg screening for DVT, and arterial health screening.  Basically almost all of her screening was normal with exception of comment of occasional ectopic heartbeats apparently noted during her echo.  She generally has very slow heart rate likely reflecting her high-level fitness as she exercises regularly.  She has not had any recent palpitations.  Does have high stress job but very little caffeine, alcohol, chocolate, etc.  No history of documented PVCs.  No exercise intolerance.  She does have history of hyperlipidemia with LDL cholesterol previously around 116.  Her father had history of CAD.  Alicia Myers had coronary calcium score of 0 last year.  Ultrasound screening above revealed no carotid plaque.  No aneurysm.  Transcranial Doppler screening revealed no evidence for stenosis or aneurysm of the cerebral arteries.  She does relate some chronic bilateral tinnitus.  She states she had a hearing screen previously but this was probably 10 years ago.  No pulsatile tinnitus.  No recent vertigo.  Does have hypothyroidism with remote history of thyroid cancer and is on levothyroxine and needs follow-up TSH.  Past Medical History:  Diagnosis Date   Biceps tendon tear    Thyroid cancer ([facility])    PAPILLARY THYROID CA --THYROIDECTOMY   Past Surgical History:  Procedure Laterality Date   AUGMENTATION MAMMAPLASTY  2007   THYROIDECTOMY     PAPILLARY THYROID CANCER   TONSILLECTOMY      reports  that she has never smoked. She has never used smokeless tobacco. She reports current alcohol use of about 2.0 standard drinks of alcohol per week. She reports that she does not use drugs. family history includes Breast cancer (age of onset: 55) in her paternal aunt; Diabetes in her father and mother; Heart disease (age of onset: 60) in her paternal grandfather. Allergies  Allergen Reactions   Sulfa Antibiotics Hives and Other (See Comments)    Passed out    Review of Systems  Constitutional:  Negative for malaise/fatigue.  Eyes:  Negative for blurred vision.  Respiratory:  Negative for shortness of breath.   Cardiovascular:  Negative for chest pain.  Neurological:  Negative for dizziness, weakness and headaches.      Objective:     BP 110/70 (BP Location: Left Arm, Patient Position: Sitting, Cuff Size: Normal)   Pulse 65   Ht 5' 2.99" (1.6 m)   Wt 113 lb 9.6 oz (51.5 kg)   LMP 09/18/2016   SpO2 99%   BMI 20.13 kg/m  BP Readings from Last 3 Encounters:  07/24/23 110/70  09/14/22 116/68  06/30/21 118/64   Wt Readings from Last 3 Encounters:  07/24/23 113 lb 9.6 oz (51.5 kg)  09/14/22 122 lb 9.6 oz (55.6 kg)  06/30/21 122 lb 12.8 oz (55.7 kg)      Physical Exam Vitals reviewed.  Constitutional:      Appearance: Normal appearance.  Cardiovascular:     Rate and Rhythm: Normal rate and regular rhythm.     Heart sounds: No murmur heard.  No gallop.  Pulmonary:     Effort: Pulmonary effort is normal.     Breath sounds: Normal breath sounds. No wheezing or rales.  Musculoskeletal:     Right lower leg: No edema.     Left lower leg: No edema.  Neurological:     Mental Status: She is alert.      Results for orders placed or performed in visit on 07/24/23  Basic metabolic panel  Result Value Ref Range   Sodium 139 135 - 145 mEq/L   Potassium 4.6 3.5 - 5.1 mEq/L   Chloride 102 96 - 112 mEq/L   CO2 28 19 - 32 mEq/L   Glucose, Bld 100 (H) 70 - 99 mg/dL   BUN 20 6  - 23 mg/dL   Creatinine, Ser 4.09 0.40 - 1.20 mg/dL   GFR 81.19 (L) >14.78 mL/min   Calcium 9.6 8.4 - 10.5 mg/dL  Lipid panel  Result Value Ref Range   Cholesterol 209 (H) 0 - 200 mg/dL   Triglycerides 29.5 0.0 - 149.0 mg/dL   HDL 62.13 >08.65 mg/dL   VLDL 78.4 0.0 - 69.6 mg/dL   LDL Cholesterol 295 (H) 0 - 99 mg/dL   Total CHOL/HDL Ratio 3    NonHDL 131.20   Hepatic function panel  Result Value Ref Range   Total Bilirubin 0.6 0.2 - 1.2 mg/dL   Bilirubin, Direct 0.1 0.0 - 0.3 mg/dL   Alkaline Phosphatase 56 39 - 117 U/L   AST 26 0 - 37 U/L   ALT 23 0 - 35 U/L   Total Protein 7.2 6.0 - 8.3 g/dL   Albumin 4.7 3.5 - 5.2 g/dL  TSH  Result Value Ref Range   TSH 0.82 0.35 - 5.50 uIU/mL    Last CBC Lab Results  Component Value Date   WBC 4.6 09/14/2022   HGB 14.9 09/14/2022   HCT 43.6 09/14/2022   MCV 94.9 09/14/2022   MCH 32.2 10/27/2020   RDW 13.5 09/14/2022   PLT 263.0 09/14/2022   Last metabolic panel Lab Results  Component Value Date   GLUCOSE 100 (H) 07/24/2023   NA 139 07/24/2023   K 4.6 07/24/2023   CL 102 07/24/2023   CO2 28 07/24/2023   BUN 20 07/24/2023   CREATININE 1.10 07/24/2023   GFR 57.28 (L) 07/24/2023   CALCIUM 9.6 07/24/2023   PROT 7.2 07/24/2023   ALBUMIN 4.7 07/24/2023   BILITOT 0.6 07/24/2023   ALKPHOS 56 07/24/2023   AST 26 07/24/2023   ALT 23 07/24/2023   Last lipids Lab Results  Component Value Date   CHOL 209 (H) 07/24/2023   HDL 77.40 07/24/2023   LDLCALC 119 (H) 07/24/2023   TRIG 61.0 07/24/2023   CHOLHDL 3 07/24/2023   Last thyroid functions Lab Results  Component Value Date   TSH 0.82 07/24/2023   T4TOTAL 6.9 12/17/2015      The 10-year ASCVD risk score (Arnett DK, et al., 2019) is: 0.9%    Assessment & Plan:   #1 acquired hypothyroidism following thyroidectomy several years ago for papillary thyroid cancer.  Recheck TSH  #2 hyperlipidemia with positive family history of CAD in her father.  Recheck lipids and  hepatic panel.  Continue low saturated fat diet.  Continue regular exercise habits.  #3 comment of some recent mild ectopy during echocardiogram.  Normal heart exam at this time.  Discussed these are probably occasional PVCs or PACs.  She already consumes very little caffeine and alcohol.  Does  have high stress job which may be contributing.  Discussed that if she is having palpitations or certainly any dizziness with exercise consider event monitor with Zio patch  #4 chronic bilateral low-grade tinnitus.  Recommend she start with getting audiology assessment and she will try to work on getting this set up.   No follow-ups on file.    Evelena Peat, MD

## 2023-07-24 NOTE — Patient Instructions (Signed)
Set up audiology screen  We might want to consider home holter heart monitor for any palpitations or dizziness--- especially with exercise.

## 2023-09-01 ENCOUNTER — Encounter: Payer: Self-pay | Admitting: Family Medicine

## 2023-09-01 MED ORDER — LEVOTHYROXINE SODIUM 100 MCG PO CAPS: 100.0000 ug | ORAL_CAPSULE | Freq: Every day | ORAL | 3 refills | Status: DC

## 2023-09-20 ENCOUNTER — Other Ambulatory Visit: Payer: Self-pay | Admitting: Family Medicine

## 2023-09-20 MED ORDER — TIROSINT 100 MCG PO CAPS: 100.0000 ug | ORAL_CAPSULE | Freq: Every day | ORAL | 3 refills | Status: DC

## 2023-09-20 NOTE — Addendum Note (Signed)
Addended by: Laneta Simmers L on: 09/20/2023 11:00 AM   Modules accepted: Orders

## 2023-09-21 ENCOUNTER — Other Ambulatory Visit: Payer: Self-pay | Admitting: Family Medicine

## 2023-09-21 NOTE — Telephone Encounter (Signed)
Spoke with the patient and informed her the Rx was denied and asked if she wanted the message sent to another provider for options as below or if she wanted to pay cash.  Patient stated she would prefer to pay cash for Tirosint instead and will contact the pharmacy for pricing.  Alternative Rx for Euthyrox was denied via escribe.

## 2023-11-28 ENCOUNTER — Other Ambulatory Visit: Payer: Self-pay | Admitting: Gynecology

## 2023-11-28 DIAGNOSIS — R92333 Mammographic heterogeneous density, bilateral breasts: Secondary | ICD-10-CM

## 2023-11-28 DIAGNOSIS — Z803 Family history of malignant neoplasm of breast: Secondary | ICD-10-CM

## 2023-12-07 ENCOUNTER — Ambulatory Visit
Admission: RE | Admit: 2023-12-07 | Discharge: 2023-12-07 | Disposition: A | Discharge status: Home / Self Care | Payer: No Typology Code available for payment source | Source: Ambulatory Visit | Attending: Gynecology | Admitting: Gynecology

## 2023-12-07 DIAGNOSIS — Z803 Family history of malignant neoplasm of breast: Secondary | ICD-10-CM

## 2023-12-07 DIAGNOSIS — R92333 Mammographic heterogeneous density, bilateral breasts: Secondary | ICD-10-CM

## 2023-12-07 MED ORDER — GADOPICLENOL 0.5 MMOL/ML IV SOLN
5.0000 mL | Freq: Once | INTRAVENOUS | Status: AC | PRN
  Administered 2023-12-07: 5 mL via INTRAVENOUS

## 2024-04-07 ENCOUNTER — Other Ambulatory Visit: Payer: Self-pay | Admitting: Family Medicine

## 2024-07-05 ENCOUNTER — Other Ambulatory Visit: Payer: Self-pay | Admitting: Family Medicine

## 2024-11-11 ENCOUNTER — Encounter: Payer: Self-pay | Admitting: Family Medicine

## 2024-11-11 ENCOUNTER — Ambulatory Visit

## 2024-11-11 ENCOUNTER — Ambulatory Visit: Payer: Self-pay | Admitting: Family Medicine

## 2024-11-11 VITALS — BP 124/70 | HR 59 | Temp 97.4°F | Ht 64.0 in | Wt 127.3 lb

## 2024-11-11 DIAGNOSIS — Z0000 Encounter for general adult medical examination without abnormal findings: Secondary | ICD-10-CM

## 2024-11-11 DIAGNOSIS — Z803 Family history of malignant neoplasm of breast: Secondary | ICD-10-CM

## 2024-11-11 DIAGNOSIS — E039 Hypothyroidism, unspecified: Secondary | ICD-10-CM

## 2024-11-11 DIAGNOSIS — Z23 Encounter for immunization: Secondary | ICD-10-CM | POA: Diagnosis not present

## 2024-11-11 LAB — LIPID PANEL
Cholesterol: 210 mg/dL — ABNORMAL HIGH (ref 0–200)
HDL: 73.2 mg/dL (ref >39.00)
LDL Cholesterol: 124 mg/dL — ABNORMAL HIGH (ref 0–99)
NonHDL: 136.5
Total CHOL/HDL Ratio: 3
Triglycerides: 63 mg/dL (ref 0.0–149.0)
VLDL: 12.6 mg/dL (ref 0.0–40.0)

## 2024-11-11 LAB — CBC WITH DIFFERENTIAL/PLATELET
Basophils Absolute: 0.1 K/uL (ref 0.0–0.1)
Basophils Relative: 1.3 % (ref 0.0–3.0)
Eosinophils Absolute: 0.2 K/uL (ref 0.0–0.7)
Eosinophils Relative: 4.2 % (ref 0.0–5.0)
HCT: 40.7 % (ref 36.0–46.0)
Hemoglobin: 13.9 g/dL (ref 12.0–15.0)
Lymphocytes Relative: 31.6 % (ref 12.0–46.0)
Lymphs Abs: 1.5 K/uL (ref 0.7–4.0)
MCHC: 34.1 g/dL (ref 30.0–36.0)
MCV: 96.1 fl (ref 78.0–100.0)
Monocytes Absolute: 0.3 K/uL (ref 0.1–1.0)
Monocytes Relative: 6.8 % (ref 3.0–12.0)
Neutro Abs: 2.7 K/uL (ref 1.4–7.7)
Neutrophils Relative %: 56.1 % (ref 43.0–77.0)
Platelets: 280 K/uL (ref 150.0–400.0)
RBC: 4.24 Mil/uL (ref 3.87–5.11)
RDW: 13.7 % (ref 11.5–15.5)
WBC: 4.8 K/uL (ref 4.0–10.5)

## 2024-11-11 LAB — HEPATIC FUNCTION PANEL
ALT: 22 U/L (ref 0–35)
AST: 25 U/L (ref 0–37)
Albumin: 4.9 g/dL (ref 3.5–5.2)
Alkaline Phosphatase: 50 U/L (ref 39–117)
Bilirubin, Direct: 0.1 mg/dL (ref 0.0–0.3)
Total Bilirubin: 0.7 mg/dL (ref 0.2–1.2)
Total Protein: 7.2 g/dL (ref 6.0–8.3)

## 2024-11-11 LAB — TSH: TSH: 9.77 u[IU]/mL — ABNORMAL HIGH (ref 0.35–5.50)

## 2024-11-11 LAB — BASIC METABOLIC PANEL WITH GFR
BUN: 16 mg/dL (ref 6–23)
CO2: 31 meq/L (ref 19–32)
Calcium: 9.5 mg/dL (ref 8.4–10.5)
Chloride: 102 meq/L (ref 96–112)
Creatinine, Ser: 0.94 mg/dL (ref 0.40–1.20)
GFR: 68.54 mL/min (ref >60.00)
Glucose, Bld: 99 mg/dL (ref 70–99)
Potassium: 4.3 meq/L (ref 3.5–5.1)
Sodium: 139 meq/L (ref 135–145)

## 2024-11-11 LAB — HEMOGLOBIN A1C: Hgb A1c MFr Bld: 5.1 % (ref 4.6–6.5)

## 2024-11-11 NOTE — Patient Instructions (Signed)
 Consider Shingrix vaccine at some point this year.

## 2024-11-11 NOTE — Addendum Note (Signed)
 Addended by: METTA KRISTEN CROME on: 11/11/2024 01:23 PM   Modules accepted: Orders

## 2024-11-11 NOTE — Progress Notes (Signed)
 Established Patient Office Visit  Subjective   Patient ID: Alicia Myers, female    DOB: 03/18/1971  Age: 53 y.o. MRN: [id number]  Chief Complaint  Patient presents with   Annual Exam    HPI   Alicia Myers seen today for physical exam.  She sees GYN regularly.  Very health-conscious and exercises regularly.  Generally doing well.  She has history of cancer and several family members as below and also past history of thyroid  cancer herself.  She is on thyroid  replacement with levothyroxine   Health maintenance reviewed:  Health Maintenance  Topic Date Due   HIV Screening  Never done   Hepatitis B Vaccines 19-59 Average Risk (1 of 3 - 19+ 3-dose series) Never done   Zoster Vaccines- Shingrix (1 of 2) Never done   COVID-19 Vaccine (2 - Moderna risk series) 03/27/2020   Cervical Cancer Screening (HPV/Pap Cotest)  03/22/2023   Mammogram  01/21/2025 (Originally 01/21/2023)   Colonoscopy  02/12/2032   DTaP/Tdap/Td (2 - Td or Tdap) 09/14/2032   Pneumococcal Vaccine: 50+ Years  Completed   Influenza Vaccine  Completed   Hepatitis C Screening  Completed   HPV VACCINES  Aged Out   Meningococcal B Vaccine  Aged Out   - She has history of dense breast and gets annual MRI of breast and these been normal. - No history of shingles vaccine and declines - Does agree to pneumonia vaccine and influenza vaccine today - Colonoscopy due 2033  Family history significant for both parents having type 2 diabetes.  Mother and paternal grandmother with breast cancer.  Maternal grandfather with colon cancer.  Sister with uterine cancer.  Social history-married.  Works in corporate investment banker.  Never smoked.  Only rare alcohol use.  Past Medical History:  Diagnosis Date   Biceps tendon tear    Thyroid  cancer ([facility])    PAPILLARY THYROID  CA --THYROIDECTOMY   Past Surgical History:  Procedure Laterality Date   AUGMENTATION MAMMAPLASTY  2007   THYROIDECTOMY     PAPILLARY THYROID  CANCER    TONSILLECTOMY      reports that she has never smoked. She has never used smokeless tobacco. She reports current alcohol use of about 2.0 standard drinks of alcohol per week. She reports that she does not use drugs. family history includes Breast cancer (age of onset: 108) in her paternal aunt; Diabetes in her father and mother; Heart disease (age of onset: 76) in her paternal grandfather. Allergies  Allergen Reactions   Sulfa Antibiotics Hives and Other (See Comments)    Passed out     Review of Systems  Constitutional:  Negative for chills, fever, malaise/fatigue and weight loss.  HENT:  Negative for hearing loss.   Eyes:  Negative for blurred vision and double vision.  Respiratory:  Negative for cough and shortness of breath.   Cardiovascular:  Negative for chest pain, palpitations and leg swelling.  Gastrointestinal:  Negative for abdominal pain, blood in stool, constipation and diarrhea.  Genitourinary:  Negative for dysuria.  Skin:  Negative for rash.  Neurological:  Negative for dizziness, speech change, seizures, loss of consciousness and headaches.  Psychiatric/Behavioral:  Negative for depression.       Objective:     BP 124/70   Pulse (!) 59   Temp (!) 97.4 F (36.3 C) (Oral)   Ht 5' 4 (1.626 m)   Wt 127 lb 4.8 oz (57.7 kg)   LMP 09/18/2016   SpO2 97%   BMI 21.85 kg/m  BP Readings from Last 3 Encounters:  11/11/24 124/70  07/24/23 110/70  09/14/22 116/68   Wt Readings from Last 3 Encounters:  11/11/24 127 lb 4.8 oz (57.7 kg)  07/24/23 113 lb 9.6 oz (51.5 kg)  09/14/22 122 lb 9.6 oz (55.6 kg)      Physical Exam Vitals reviewed.  Constitutional:      General: She is not in acute distress.    Appearance: She is well-developed. She is not ill-appearing.  HENT:     Head: Normocephalic and atraumatic.  Eyes:     Pupils: Pupils are equal, round, and reactive to light.  Neck:     Thyroid : No thyromegaly.  Cardiovascular:     Rate and Rhythm: Normal rate  and regular rhythm.     Heart sounds: Normal heart sounds. No murmur heard. Pulmonary:     Effort: No respiratory distress.     Breath sounds: Normal breath sounds. No wheezing or rales.  Abdominal:     General: Bowel sounds are normal. There is no distension.     Palpations: Abdomen is soft. There is no mass.     Tenderness: There is no abdominal tenderness. There is no guarding or rebound.  Musculoskeletal:        General: Normal range of motion.     Cervical back: Normal range of motion and neck supple.     Right lower leg: No edema.     Left lower leg: No edema.  Lymphadenopathy:     Cervical: No cervical adenopathy.  Skin:    Findings: No rash.  Neurological:     Mental Status: She is alert and oriented to person, place, and time.     Cranial Nerves: No cranial nerve deficit.  Psychiatric:        Behavior: Behavior normal.        Thought Content: Thought content normal.        Judgment: Judgment normal.      No results found for any visits on 11/11/24.    The 10-year ASCVD risk score (Arnett DK, et al., 2019) is: 1.4%    Assessment & Plan:   Problem List Items Addressed This Visit   None Visit Diagnoses       Physical exam    -  Primary   Relevant Orders   Basic metabolic panel with GFR   Lipid panel   CBC with Differential/Platelet   Hepatic function panel   Hemoglobin A1c     Need for influenza vaccination       Relevant Orders   Flu vaccine trivalent PF, 6mos and older(Flulaval,Afluria,Fluarix,Fluzone) (Completed)     Need for pneumococcal vaccination       Relevant Orders   Pneumococcal conjugate vaccine 20-valent (Prevnar 20) (Completed)     Family history of breast cancer         Alicia Myers is here today for physical exam.  She is very health-conscious.  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  Multiple family members with cancer as above.  -Obtain lab work as above.  Including A1c with family history of type 2  diabetes in mother and father. - Recommend influenza and Prevnar 20 vaccine and she consents to both - Consider Shingrix vaccine at some point this year - We did discuss possibly setting up genetic counseling with her strong family history and she would like to proceed with that - Continue regular exercise habits  No follow-ups on file.    Wolm Scarlet, MD

## 2024-11-12 MED ORDER — LEVOTHYROXINE SODIUM 112 MCG PO TABS: 112.0000 ug | ORAL_TABLET | Freq: Every day | ORAL | 0 refills | Status: AC

## 2025-01-08 ENCOUNTER — Inpatient Hospital Stay: Payer: Self-pay

## 2025-01-08 ENCOUNTER — Inpatient Hospital Stay: Payer: Self-pay | Attending: Nurse Practitioner | Admitting: Genetic Counselor

## 2025-01-08 ENCOUNTER — Encounter: Payer: Self-pay | Admitting: Genetic Counselor

## 2025-01-08 DIAGNOSIS — Z803 Family history of malignant neoplasm of breast: Secondary | ICD-10-CM

## 2025-01-08 DIAGNOSIS — C73 Malignant neoplasm of thyroid gland: Secondary | ICD-10-CM

## 2025-01-08 DIAGNOSIS — Z8049 Family history of malignant neoplasm of other genital organs: Secondary | ICD-10-CM

## 2025-01-08 LAB — GENETIC SCREENING ORDER

## 2025-01-08 NOTE — Progress Notes (Signed)
 REFERRING PROVIDER: Micheal Wolm ORN, MD [address] [place],  [place] [address]  PRIMARY PROVIDER:  Micheal Wolm ORN, MD  PRIMARY REASON FOR VISIT:  1. Family history of malignant neoplasm of breast   2. Family history of malignant neoplasm of genital organ   3. Malignant tumor of thyroid  gland (HCC)     HISTORY OF PRESENT ILLNESS:   Ms. Heater, a 54 y.o. female, was seen for a [place] cancer genetics consultation at the request of Burchette, Wolm ORN, MD due to a personal and family history of cancer.  Ms. Carcione presents to clinic today to discuss the possibility of a hereditary predisposition to cancer, to discuss genetic testing, and to further clarify her future cancer risks, as well as potential cancer risks for family members.   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 This was treated with thyroidectomy and radiation.    RELEVANT MEDICAL HISTORY:  Menarche was at age 25.  First live birth at age 53.  Ovaries intact: yes.  Uterus intact: yes.  Menopausal status: postmenopausal. Age 1 HRT use: 9 years. Progesterone  and estradiol .  Colonoscopy: yes; normal. Mammogram within the last year: no. Last mammogram 2021. Extremely dense tissue, has been self-paying to have annual mammogram  Number of breast biopsies: 0.  Past Medical History:  Diagnosis Date   Biceps tendon tear    Thyroid  cancer (HCC)    PAPILLARY THYROID  [place] --THYROIDECTOMY    Past Surgical History:  Procedure Laterality Date   AUGMENTATION MAMMAPLASTY  2007   THYROIDECTOMY     PAPILLARY THYROID  CANCER   TONSILLECTOMY      Social History   Socioeconomic History   Marital status: Married    Spouse name: Not on file   Number of children: Not on file   Years of education: Not on file   Highest education level: Bachelor's degree (e.g., BA, AB, BS)  Occupational History   Not on file  Tobacco Use   Smoking status: Never   Smokeless tobacco: Never  Vaping  Use   Vaping status: Never Used  Substance and Sexual Activity   Alcohol use: Yes    Alcohol/week: 2.0 standard drinks of alcohol    Types: 2 Standard drinks or equivalent per week   Drug use: No   Sexual activity: Yes    Birth control/protection: Post-menopausal    Comment: 1st intercourse 54 yo-Fewer than 5 partners  Other Topics Concern   Not on file  Social History Narrative   Not on file   Social Drivers of Health   Tobacco Use: Low Risk (11/11/2024)   Patient History    Smoking Tobacco Use: Never    Smokeless Tobacco Use: Never    Passive Exposure: Not on file  Financial Resource Strain: Low Risk (07/20/2023)   Overall Financial Resource Strain (CARDIA)    Difficulty of Paying Living Expenses: Not hard at all  Food Insecurity: No Food Insecurity (07/20/2023)   Hunger Vital Sign    Worried About Running Out of Food in the Last Year: Never true    Ran Out of Food in the Last Year: Never true  Transportation Needs: No Transportation Needs (07/20/2023)   PRAPARE - Administrator, Civil Service (Medical): No    Lack of Transportation (Non-Medical): No  Physical Activity: Insufficiently Active (07/20/2023)   Exercise Vital Sign    Days of Exercise per Week: 2 days    Minutes of Exercise per Session: 40  min  Stress: Not on file (10/23/2023)  Social Connections: Socially Integrated (07/20/2023)   Social Connection and Isolation Panel    Frequency of Communication with Friends and Family: More than three times a week    Frequency of Social Gatherings with Friends and Family: Twice a week    Attends Religious Services: More than 4 times per year    Active Member of Clubs or Organizations: Yes    Attends Banker Meetings: More than 4 times per year    Marital Status: Married  Depression (PHQ2-9): Low Risk (11/11/2024)   Depression (PHQ2-9)    PHQ-2 Score: 0  Alcohol Screen: Low Risk (07/20/2023)   Alcohol Screen    Last Alcohol Screening Score (AUDIT): 2   Housing: Low Risk (07/20/2023)   Housing    Last Housing Risk Score: 0  Utilities: Not on file  Health Literacy: Not on file     FAMILY HISTORY:  We obtained a detailed, 4-generation family history.  Significant diagnoses are listed below: Family History  Problem Relation Age of Onset   Diabetes Mother    Breast cancer Mother 26   Diabetes Father    Cancer Sister 20       uterine - maybe cervical   Prostate cancer Maternal Grandfather 64 - 79   Breast cancer Paternal Grandmother 44   Heart disease Paternal Grandfather 21       heart attack    Ms. Lieurance is unaware of previous family history of genetic testing for hereditary cancer risks. There is no reported Ashkenazi Jewish ancestry.     GENETIC COUNSELING ASSESSMENT: Ms. Rayson is a 54 y.o. female with a personal and family history of cancer which is somewhat suggestive of a hereditary predisposition to cancer given her family history of a first degree relative diagnosed with uterine cancer under the age of 43. We, therefore, discussed and recommended the following at today's visit.   DISCUSSION: We discussed that 5 - 10% of cancer is hereditary, with most cases of uterine cancer associated with Lynch syndrome.  There are other genes that can be associated with hereditary uterine, breast cancer syndromes.  We discussed that testing is beneficial for several reasons including knowing how to follow individuals for cancer surveillance, consider options for prevention, and understanding if other family members could be at risk for cancer and allowing them to undergo genetic testing.   We reviewed the characteristics, features and inheritance patterns of hereditary cancer syndromes. We also discussed genetic testing, including the appropriate family members to test, the process of testing, insurance coverage and turn-around-time for results. We discussed the implications of a negative, positive, carrier and/or variant of uncertain  significant result. We recommended Ms. Bosher pursue genetic testing for a panel that includes genes associated with uterine and breast cancer cancer.   Ms. Crothers  was offered a common hereditary cancer panel (40+ genes) and an expanded pan-cancer panel (70+ genes). Ms. Colasurdo was informed of the benefits and limitations of each panel, including that expanded pan-cancer panels contain genes that do not have clear management guidelines at this point in time.  We also discussed that as the number of genes included on a panel increases, the chances of variants of uncertain significance increases. After considering the benefits and limitations of each gene panel, Ms. Timoney elected to have [facility] CancerNext-Expanded +RNAinsight panel. The CancerNext-Expanded gene panel offered by [facility] and includes sequencing, rearrangement, and RNA analysis for the following 77 genes: AIP, ALK, APC, ATM,  AXIN2, BAP1, BARD1, BMPR1A, BRCA1, BRCA2, BRIP1, CDC73, CDH1, CDK4, CDKN1B, CDKN2A, CEBPA, CHEK2, CTNNA1, DDX41, DICER1, EGFR, EPCAM, ETV6, FH, FLCN, GATA2, GREM1, HOXB13, KIT, LZTR1, MAX, MBD4, MEN1, MET, MITF, MLH1, MSH2, MSH3, MSH6, MUTYH, NF1, NF2, NTHL1, PALB2, PDGFRA, PHOX2B, PMS2, POLD1, POLE, POT1, PRKAR1A, PTCH1, PTEN, RAD51C, RAD51D, RB1, RET, RPS20, RUNX1, SDHA, SDHAF2, SDHB, SDHC, SDHD, SMAD4, SMARCA4, SMARCB1, SMARCE1, STK11, SUFU, TMEM127, TP53, TSC1, TSC2, VHL, WT1.    Based on Ms. Rubano's family history of cancer, she meets medical criteria (NCCN Lynch) for genetic testing. Despite that she meets criteria, she may still have an out of pocket cost. We discussed that if her out of pocket cost for testing is over $100, the laboratory should contact them to discuss self-pay prices, patient pay assistance programs, if applicable, and other billing options.  We discussed that some people do not want to undergo genetic testing due to fear of genetic discrimination.  A federal law called the Genetic  Information Non-Discrimination Act (GINA) of 2008 helps protect individuals against genetic discrimination based on their genetic test results.  It impacts both health insurance and employment.  With health insurance, it protects against increased premiums, being kicked off insurance or being forced to take a test in order to be insured.  For employment it protects against hiring, firing and promoting decisions based on genetic test results.  GINA does not apply to those in the [employer], those who work for companies with less than 15 employees, and new life insurance or long-term disability insurance policies.  Health status due to a cancer diagnosis is not protected under GINA.  Will run breast cancer risk model and discuss with return of results.   PLAN: After considering the risks, benefits, and limitations, Ms. Zachman provided informed consent to pursue genetic testing and the blood sample was sent to [facility] for analysis of the CancerNext-Expanded +RNAinsight panel. Results should be available within approximately 2-3 weeks' time, at which point they will be disclosed by telephone to Ms. Tisdell, as will any additional recommendations warranted by these results. Ms. Bennion will receive a summary of her genetic counseling visit and a copy of her results once available. This information will also be available in Epic.   Lastly, we encouraged Ms. Handa to remain in contact with cancer genetics annually so that we can continuously update the family history and inform her of any changes in cancer genetics and testing that may be of benefit for this family.   Ms. Middendorf questions were answered to her satisfaction today. Our contact information was provided should additional questions or concerns arise. Thank you for the referral and allowing us  to share in the care of your patient.   Burnard Ogren, MS, [facility] Licensed, [email or web address] phone:  [phone]   60 minutes were spent on the date of the encounter in service to the patient including preparation, face-to-face consultation, documentation and care coordination.  The patient was seen alone.  Drs. Gudena and/or Lanny were available to discuss this case as needed.   _______________________________________________________________________ For Office Staff:  Number of people involved in session: 1 Was an Intern/ student involved with case: no

## 2025-01-15 ENCOUNTER — Encounter: Payer: Self-pay | Admitting: Gynecology

## 2025-01-16 ENCOUNTER — Other Ambulatory Visit: Payer: Self-pay | Admitting: Gynecology

## 2025-01-16 DIAGNOSIS — Z803 Family history of malignant neoplasm of breast: Secondary | ICD-10-CM

## 2025-01-21 ENCOUNTER — Telehealth: Payer: Self-pay | Admitting: Genetic Counselor

## 2025-01-21 NOTE — Telephone Encounter (Signed)
 LVM asking for call back to review results of genetic testing.

## 2025-02-10 ENCOUNTER — Other Ambulatory Visit

## 2025-02-21 ENCOUNTER — Ambulatory Visit: Payer: Self-pay
